# Patient Record
Sex: Female | Born: 1965 | Race: Black or African American | Hispanic: No | Marital: Single | Smoking: Never smoker
Health system: Southern US, Community
[De-identification: ages and names within clinical notes are randomized; demographics above are authoritative.]

## PROBLEM LIST (undated history)

## (undated) DIAGNOSIS — D219 Benign neoplasm of connective and other soft tissue, unspecified: Secondary | ICD-10-CM

## (undated) DIAGNOSIS — G4730 Sleep apnea, unspecified: Secondary | ICD-10-CM

## (undated) HISTORY — PX: UTERINE FIBROID EMBOLIZATION: SHX825

## (undated) HISTORY — PX: CHOLECYSTECTOMY: SHX55

---

## 2012-09-09 ENCOUNTER — Emergency Department (HOSPITAL_BASED_OUTPATIENT_CLINIC_OR_DEPARTMENT_OTHER): Payer: Self-pay

## 2012-09-09 ENCOUNTER — Emergency Department (HOSPITAL_BASED_OUTPATIENT_CLINIC_OR_DEPARTMENT_OTHER)
Admission: EM | Admit: 2012-09-09 | Discharge: 2012-09-09 | Discharge status: Against Medical Advice | Payer: Self-pay | Attending: Emergency Medicine | Admitting: Emergency Medicine

## 2012-09-09 ENCOUNTER — Encounter (HOSPITAL_BASED_OUTPATIENT_CLINIC_OR_DEPARTMENT_OTHER): Payer: Self-pay

## 2012-09-09 DIAGNOSIS — R5381 Other malaise: Secondary | ICD-10-CM | POA: Insufficient documentation

## 2012-09-09 DIAGNOSIS — R079 Chest pain, unspecified: Secondary | ICD-10-CM

## 2012-09-09 DIAGNOSIS — R0602 Shortness of breath: Secondary | ICD-10-CM | POA: Insufficient documentation

## 2012-09-09 DIAGNOSIS — R0789 Other chest pain: Secondary | ICD-10-CM | POA: Insufficient documentation

## 2012-09-09 DIAGNOSIS — R609 Edema, unspecified: Secondary | ICD-10-CM | POA: Insufficient documentation

## 2012-09-09 LAB — COMPREHENSIVE METABOLIC PANEL WITH GFR
ALT: 23 U/L (ref 0–35)
AST: 21 U/L (ref 0–37)
Albumin: 3.9 g/dL (ref 3.5–5.2)
Alkaline Phosphatase: 43 U/L (ref 39–117)
BUN: 7 mg/dL (ref 6–23)
CO2: 27 meq/L (ref 19–32)
Calcium: 9.6 mg/dL (ref 8.4–10.5)
Chloride: 104 meq/L (ref 96–112)
Creatinine, Ser: 0.9 mg/dL (ref 0.50–1.10)
GFR calc Af Amer: 88 mL/min — ABNORMAL LOW
GFR calc non Af Amer: 76 mL/min — ABNORMAL LOW
Glucose, Bld: 111 mg/dL — ABNORMAL HIGH (ref 70–99)
Potassium: 4.7 meq/L (ref 3.5–5.1)
Sodium: 139 meq/L (ref 135–145)
Total Bilirubin: 0.4 mg/dL (ref 0.3–1.2)
Total Protein: 7.9 g/dL (ref 6.0–8.3)

## 2012-09-09 LAB — D-DIMER, QUANTITATIVE: D-Dimer, Quant: 0.29 ug{FEU}/mL (ref 0.00–0.48)

## 2012-09-09 LAB — CBC WITH DIFFERENTIAL/PLATELET
Basophils Absolute: 0 10*3/uL (ref 0.0–0.1)
Eosinophils Relative: 5 % (ref 0–5)
Lymphocytes Relative: 32 % (ref 12–46)
Lymphs Abs: 1.4 10*3/uL (ref 0.7–4.0)
MCV: 92.5 fL (ref 78.0–100.0)
Neutrophils Relative %: 47 % (ref 43–77)
Platelets: 292 10*3/uL (ref 150–400)
RBC: 4.28 MIL/uL (ref 3.87–5.11)
RDW: 12.9 % (ref 11.5–15.5)
WBC: 4.5 10*3/uL (ref 4.0–10.5)

## 2012-09-09 LAB — TROPONIN I: Troponin I: <0.3 ng/mL (ref <0.30)

## 2012-09-09 MED ORDER — ASPIRIN 81 MG PO CHEW
324.0000 mg | CHEWABLE_TABLET | Freq: Once | ORAL | Status: AC
  Administered 2012-09-09: 324 mg via ORAL
  Filled 2012-09-09: qty 4

## 2012-09-09 MED ORDER — NITROGLYCERIN 0.4 MG SL SUBL
0.4000 mg | SUBLINGUAL_TABLET | SUBLINGUAL | Status: DC | PRN
  Administered 2012-09-09 (×2): 0.4 mg via SUBLINGUAL
  Filled 2012-09-09: qty 25

## 2012-09-09 NOTE — ED Notes (Signed)
MD at bedside. 

## 2012-09-09 NOTE — ED Notes (Signed)
Patient transported to X-ray 

## 2012-09-09 NOTE — ED Notes (Signed)
Pt reports a 3 day history of intermittent midsternal chest pain radiating left shouler and right breast.

## 2012-09-09 NOTE — ED Provider Notes (Signed)
History     CSN: [id number]  Arrival date & time 09/09/12  9562   First MD Initiated Contact with Patient 09/09/12 1007      Chief Complaint  Patient presents with  . Chest Pain    (Consider location/radiation/quality/duration/timing/severity/associated sxs/prior treatment) Patient is a 46 y.o. female presenting with chest pain. The history is provided by the patient.  Chest Pain The chest pain began 3 - 5 days ago. Chest pain occurs intermittently. The chest pain is worsening. The quality of the pain is described as pressure-like. Primary symptoms include fatigue and shortness of breath. Pertinent negatives for primary symptoms include no fever, no cough, no wheezing, no palpitations, no abdominal pain, no nausea and no vomiting.  Associated symptoms include lower extremity edema and weakness. Treatments tried: aleve. Risk factors include obesity and lack of exercise (mother with hsitory of atrial fibrillation).  Her family medical history is significant for diabetes in family, hyperlipidemia in family and hypertension in family.  Pertinent negatives for family medical history include: no CAD in family.  Procedure history is negative for cardiac catheterization, echocardiogram and exercise treadmill test.     History reviewed. No pertinent past medical history.  Past Surgical History  Procedure Date  . Cholecystectomy   . Cesarean section     No family history on file.  History  Substance Use Topics  . Smoking status: Never Smoker   . Smokeless tobacco: Not on file  . Alcohol Use: No    OB History    Grav Para Term Preterm Abortions TAB SAB Ect Mult Living                  Review of Systems  Constitutional: Positive for fatigue. Negative for fever.  Respiratory: Positive for shortness of breath. Negative for cough and wheezing.   Cardiovascular: Positive for chest pain. Negative for palpitations.  Gastrointestinal: Negative for nausea, vomiting and abdominal  pain.  Neurological: Positive for weakness.  All other systems reviewed and are negative.    Allergies  Review of patient's allergies indicates no known allergies.  Home Medications  No current outpatient prescriptions on file.  BP 164/84  Pulse 86  Resp 18  Ht 5\' 4"  (1.626 m)  Wt 300 lb (136.079 kg)  BMI 51.49 kg/m2  SpO2 99%  LMP 09/02/2012  Physical Exam  Nursing note and vitals reviewed. Constitutional: She appears well-developed and well-nourished.       Morbidly obese  HENT:  Head: Normocephalic and atraumatic.  Right Ear: External ear normal.  Left Ear: External ear normal.  Nose: Nose normal.  Mouth/Throat: Oropharynx is clear and moist.  Eyes: Conjunctivae normal and EOM are normal. Pupils are equal, round, and reactive to light.  Neck: Normal range of motion. Neck supple.  Cardiovascular: Normal rate, regular rhythm, normal heart sounds and intact distal pulses.   Pulmonary/Chest: Effort normal and breath sounds normal. She exhibits tenderness.  Abdominal: Soft. Bowel sounds are normal.  Musculoskeletal: Normal range of motion.  Neurological: She is alert.  Skin: Skin is warm and dry.  Psychiatric: She has a normal mood and affect. Thought content normal.    ED Course  Procedures (including critical care time)  Labs Reviewed - No data to display No results found.   No diagnosis found.  Date: 09/09/2012  Rate: 79  Rhythm: normal sinus rhythm  QRS Axis: normal  Intervals: normal  ST/T Wave abnormalities: normal  Conduction Disutrbances: none  Narrative Interpretation: unremarkable  MDM  Patient pain free after two nitro and aspirin.  Patient advised to be admitted.  Patient does not want to stay   Patient advised of risks including death and encouraged to return if she changes her mind.         Hilario Quarry, MD 09/09/12 [phone]

## 2014-10-01 ENCOUNTER — Other Ambulatory Visit: Payer: Self-pay | Admitting: Obstetrics and Gynecology

## 2014-10-01 DIAGNOSIS — D259 Leiomyoma of uterus, unspecified: Secondary | ICD-10-CM

## 2014-10-08 ENCOUNTER — Telehealth: Payer: Self-pay | Admitting: Emergency Medicine

## 2014-10-08 ENCOUNTER — Ambulatory Visit
Admission: RE | Admit: 2014-10-08 | Discharge: 2014-10-08 | Disposition: A | Discharge status: Home / Self Care | Payer: Medicaid Other | Source: Ambulatory Visit | Attending: Obstetrics and Gynecology | Admitting: Obstetrics and Gynecology

## 2014-10-08 ENCOUNTER — Other Ambulatory Visit: Payer: Self-pay | Admitting: Emergency Medicine

## 2014-10-08 DIAGNOSIS — D259 Leiomyoma of uterus, unspecified: Secondary | ICD-10-CM

## 2014-10-08 HISTORY — PX: IR GENERIC HISTORICAL: IMG1180011

## 2014-10-08 HISTORY — DX: Sleep apnea, unspecified: G47.30

## 2014-10-08 HISTORY — DX: Benign neoplasm of connective and other soft tissue, unspecified: D21.9

## 2014-10-08 NOTE — Telephone Encounter (Signed)
CALLED PT TO LET HER KNOW OF HER MRI APPT AT [facility] FOR 10-15-14 AT 800/730AM WILL CALL WITH RESULTS

## 2014-10-08 NOTE — Consult Note (Signed)
Chief Complaint: Chief Complaint  Patient presents with  . Advice Only    Consult for [place]    Referring Physician(s): White,Ronda S.  History of Present Illness: Patricia Burgess is a 49 y.o. female  (G1, P3) who presents to the interventional radiology clinic for evaluation of symptomatic uterine fibroids. The patient is unaccompanied and serves as her own historian.  The patient was initially diagnosed with uterine fibroids in July/August of last year on an pelvic ultrasound performed for the evaluation of this menorrhagia. Pelvic ultrasound (performed 03/20/2014) and demonstrates at least 7 discrete fibroids.  Since that time, patient complains of worsening menorrhagia with increasing length of her cycle, now last seen approximate 7-8 days, the heaviest 1-2 days of which are associated with the passage of clots. Patient reports changing pads and tampons every 3-4 hours. Patient denies spotting. Patient denies perimenopausal symptoms.  The patient also reports worsening bulk symptoms which she at least partially attributes to her fibroids including worsening pelvic pain, bloating, urinary frequency, urgency and nocturia. Patient does admit to worsening back pain though admits her back pain preexisted her recent diagnosis of uterine fibroids.   The patient denies bloody or melanotic stools though does report intermittent episodes of constipation. No diarrhea. No dysuria, hematuria or flank pain. No foul-smelling urine.   The patient underwent an endometrial biopsy on 09/24/2014 which was negative for the presence of hyperplasia or carcinoma.  The patient is not interested in undergoing a surgical procedure.     Past Medical History  Diagnosis Date  . Fibroids   . Sleep apnea     Past Surgical History  Procedure Laterality Date  . Cholecystectomy    . Cesarean section      Allergies: Penicillins  Medications: Prior to Admission medications   Not on File    No family history  on file.  History   Social History  . Marital Status: Single    Spouse Name: N/A    Number of Children: N/A  . Years of Education: N/A   Social History Main Topics  . Smoking status: Never Smoker   . Smokeless tobacco: Not on file  . Alcohol Use: No  . Drug Use: No  . Sexual Activity: Not on file   Other Topics Concern  . Not on file   Social History Narrative    ECOG Status: 0 - Asymptomatic  Review of Systems: A 12 point ROS discussed and pertinent positives are indicated in the HPI above.  All other systems are negative.  Review of Systems  Constitutional: Negative.   Respiratory: Negative.   Cardiovascular: Negative for chest pain, palpitations and leg swelling.  Gastrointestinal: Positive for nausea and constipation.  Genitourinary: Positive for urgency, menstrual problem and pelvic pain. Negative for dysuria, hematuria, flank pain and difficulty urinating.  Musculoskeletal: Positive for back pain.  Psychiatric/Behavioral: Negative.     Vital Signs: BP 138/85 mmHg  Pulse 84  Temp(Src) 98.3 F (36.8 C) (Oral)  Resp 14  Ht 5\' 2"  (1.575 m)  Wt 315 lb (142.883 kg)  BMI 57.60 kg/m2  SpO2 98%  LMP 10/03/2014  Physical Exam  Constitutional: She appears well-developed and well-nourished.  Obese   HENT:  Head: Normocephalic and atraumatic.  Cardiovascular: Normal rate, regular rhythm and intact distal pulses.   Easily palpable right DP pulse.  Pulmonary/Chest: Effort normal and breath sounds normal.  Abdominal: Soft. She exhibits no distension and no mass. There is tenderness. There is no rebound and no guarding.  Mild diffuse tenderness with palpation of the lower abdomen.  I am unable to palpate the uterine fundus in large part due to the patient's pannus.  Psychiatric: She has a normal mood and affect. Her behavior is normal.   Imaging:  Pelvic ultrasound -03/20/2014 ([facility]) - the uterus is enlarged measuring 13.9 x 10.3 x 8.2 cm  and demonstrates multiple uterine fibroids (at least 7) measuring up to 3.8 cm in maximal diameter.  Normal appearance and thickness of the endometrium.  The bilateral ovaries are normal in appearance. No discrete ovarian or adnexal mass.  Labs:  The patient underwent an endometrial biopsy on 09/24/2014 which was negative for the presence of hyperplasia or carcinoma  CBC: No results for input(s): WBC, HGB, HCT, PLT in the last 8760 hours.  COAGS: No results for input(s): INR, APTT in the last 8760 hours.  BMP: No results for input(s): NA, K, CL, CO2, GLUCOSE, BUN, CALCIUM, CREATININE, GFRNONAA, GFRAA in the last 8760 hours.  Invalid input(s): CMP  LIVER FUNCTION TESTS: No results for input(s): BILITOT, AST, ALT, ALKPHOS, PROT, ALBUMIN in the last 8760 hours.  Assessment and Plan:  Patricia Burgess is a 49 y.o. female  (G1, P58) who presents to the interventional radiology clinic for evaluation of symptomatic uterine fibroids. The patient was initially diagnosed with uterine fibroids in July/August of last year on an pelvic ultrasound performed for the evaluation of this menorrhagia.  Since that time, patient complains of worsening menorrhagia and bulk symptoms.   Prolonged discussions were held with the patient regarding treatment of uterine fibroids, including conservative management and surgical interventions (hysterectomy and myomectomy). At the present time, the patient wishes to avoid a surgical procedure. As such, prolonged discussions were held with the patient regarding the benefits and risks (including but not limited to bleeding, infection, nontarget embolization, contrast/radiation exposure and incomplete embolization) of uterine fibroid embolization.  I discussed with the patient that uterine fibroid embolization will likely have a marked beneficial result with her menorrhagia, though that it could take several months until she noticed a subjective improvement in her bulk-like symptoms  if they are indeed attributable to her fibroids.  Following this candid conversation, the patient wishes to proceed with uterine fibroid embolization. As such, a contrast enhanced pelvic MRI will be obtained to better evaluate her fibroid burden as well as to ensure fibroid enhancement.   The patient has had an endometrial biopsy performed 09/2014 which was negative for the presence of hyperplasia or carcinoma.  We will call the referring physician's office to see if the patient has had a recent Pap smear. If not, one will be arranged.  Pending the results of the abdominal MRI, the patient will be scheduled to go undergo uterine fibroid embolization, either at [facility] or [facility], depending on convenience of scheduling at a time between her cycles. The patient has no preference for which facility the procedure will be performed. Regardless, the procedure will entail an overnight observation for continued observation and PCA usage.  Thank you for this interesting consult.  I greatly enjoyed meeting Patricia Burgess and look forward to participating in her care.  Ronny Bacon, MD   I spent a total of 30 minutes face to face in clinical consultation, greater than 50% of which was counseling/coordinating care for symptomatic uterine fibroids.  SignedSandi Mariscal 10/08/2014, 10:53 AM

## 2014-10-22 ENCOUNTER — Telehealth: Payer: Self-pay | Admitting: Emergency Medicine

## 2014-10-22 NOTE — Telephone Encounter (Signed)
S/w pt and explained that Dr Pascal Lux reviewed MRI and she is a good candidate for [place].  I will have Tiff at [facility] to contact her and set up procedure.  She will be on her cycle next week!

## 2014-10-23 ENCOUNTER — Other Ambulatory Visit: Payer: Self-pay | Admitting: Interventional Radiology

## 2014-10-23 DIAGNOSIS — D259 Leiomyoma of uterus, unspecified: Secondary | ICD-10-CM

## 2014-11-07 ENCOUNTER — Other Ambulatory Visit: Payer: Self-pay | Admitting: Radiology

## 2014-11-07 DIAGNOSIS — D259 Leiomyoma of uterus, unspecified: Secondary | ICD-10-CM

## 2014-11-09 ENCOUNTER — Telehealth: Payer: Self-pay | Admitting: Emergency Medicine

## 2014-11-09 ENCOUNTER — Other Ambulatory Visit (HOSPITAL_COMMUNITY): Payer: Medicaid Other

## 2014-11-09 NOTE — Telephone Encounter (Signed)
Pt called w/ c/c of severe lower abd pain. She is taking 2 tabs of oxycodone q6hrs for pain.  Its only lasting 4hrs.  Taking 800mg  of IBU in between pain meds. Also stated that stool softner is not helping.  Paged Drake Leach- 143pmLennette Bihari stated for pt to take Murilax.  If any additional pain after that to call us back.  Called pt with above info and scheduled her f/u visit.- 1355pm

## 2014-11-16 ENCOUNTER — Other Ambulatory Visit: Payer: Self-pay | Admitting: Radiology

## 2014-11-16 DIAGNOSIS — D259 Leiomyoma of uterus, unspecified: Secondary | ICD-10-CM

## 2014-11-16 MED ORDER — IBUPROFEN 800 MG PO TABS: 800.0000 mg | ORAL_TABLET | Freq: Four times a day (QID) | ORAL | Status: DC | PRN

## 2014-11-16 NOTE — Progress Notes (Signed)
11:15 am  Patient phoned requesting a refill of Ibuprofen.  States that she is experiencing mild-moderate cramping discomfort post [place].  Afebrile.  Spotting, no discharge.  Notified Dr. Pascal Lux.  Ibuprofen Rx called in to patient's pharmacy.    Patricia Coste Riki Rusk, RN 11/16/2014 11:45 AM

## 2014-11-23 ENCOUNTER — Other Ambulatory Visit (HOSPITAL_COMMUNITY): Payer: Medicaid Other

## 2014-11-23 ENCOUNTER — Ambulatory Visit (HOSPITAL_COMMUNITY): Payer: Medicaid Other

## 2014-11-24 ENCOUNTER — Ambulatory Visit
Admission: RE | Admit: 2014-11-24 | Discharge: 2014-11-24 | Disposition: A | Discharge status: Home / Self Care | Payer: Medicaid Other | Source: Ambulatory Visit | Attending: Radiology | Admitting: Radiology

## 2014-11-24 DIAGNOSIS — D259 Leiomyoma of uterus, unspecified: Secondary | ICD-10-CM

## 2014-11-24 NOTE — Progress Notes (Signed)
Patient ID: Patricia Burgess, female   DOB: Feb 04, 1966, 49 y.o.   MRN: [id number]    Chief Complaint: Post uterine fibroid embolization performed 11/06/2014  Referring Physician(s): Nunzio Cobbs S  History of Present Illness: Patricia Burgess is a 49 y.o. female (G1, P54) who returns to the interventional radiology clinic following bilateral uterine artery embolization performed 11/06/2014. The patient is unaccompanied and serves as her own historian.  Patient was initially seen in consultation for symptomatic uterine fibroids at the IR clinic on 10/08/2014. At presentation, the patient reported worsening menorrhagia with increasing length of the time of her cycle, lasting of present is 7-8 days, the heaviest 1-2 days of which were associated with the passage of blood clots. The patient also admitted to bulk symptoms including pelvic pain, bloating, urinary frequency, urgency and nocturia.   The patient underwent a pelvic MRI on 10/15/2014 which demonstrated multiple heterogeneously enhancing uterine fibroids and subsequently underwent a technically successful bilateral uterine artery embolization on 11/06/2014 performed at [facility]. The patient was discharged the following day without incident.  The patient has recovered uneventfully following the uterine artery embolization. The patient states she is no longer taking any ibuprofen. The patient has not had a subsequent menstrual cycle since the uterine fibroid embolization though subjectively states improvement in her pelvic pain. Patient denies, spotting, discharge, fever or chills. No hematuria or flank pain.    At this point, the patient is overall pleased with the procedure.   Past Medical History  Diagnosis Date  . Fibroids   . Sleep apnea     Past Surgical History  Procedure Laterality Date  . Cholecystectomy    . Cesarean section      Allergies: Penicillins  Medications: Prior to Admission medications   Medication Sig Start  Date End Date Taking? Authorizing Provider  ibuprofen (ADVIL,MOTRIN) 800 MG tablet Take 1 tablet (800 mg total) by mouth every 6 (six) hours as needed for moderate pain. 11/16/14   Sandi Mariscal, MD     No family history on file.  History   Social History  . Marital Status: Single    Spouse Name: N/A  . Number of Children: N/A  . Years of Education: N/A   Social History Main Topics  . Smoking status: Never Smoker   . Smokeless tobacco: Not on file  . Alcohol Use: No  . Drug Use: No  . Sexual Activity: Not on file   Other Topics Concern  . Not on file   Social History Narrative    Review of Systems: A 12 point ROS discussed and pertinent positives are indicated in the HPI above.  All other systems are negative.  Review of Systems  Constitutional: Negative for fever, chills and fatigue.  Respiratory: Negative.   Cardiovascular: Negative.   Gastrointestinal: Negative.   Genitourinary: Negative for urgency, hematuria, flank pain, vaginal bleeding, vaginal discharge and pelvic pain.  Psychiatric/Behavioral: Negative.     Vital Signs: There were no vitals taken for this visit.  Physical Exam  Constitutional: She appears well-developed and well-nourished.  HENT:  Head: Normocephalic and atraumatic.  Cardiovascular: Normal rate, regular rhythm and intact distal pulses.   Pulmonary/Chest: Effort normal and breath sounds normal.  Abdominal: Soft. Bowel sounds are normal. She exhibits no mass. There is no tenderness.  Again, I am unable to palpate the uterine fundus.  Skin: Skin is warm and dry.  Psychiatric: She has a normal mood and affect.   Imaging:  Selected images from the patient's preprocedural  pelvic MRI performed 10/15/2014 as well as angiographic images from the bilateral uterine artery embolization performed 11/06/2014 were reviewed in detail with the patient.  Labs:  CBC: No results for input(s): WBC, HGB, HCT, PLT in the last 8760 hours.  COAGS: No results  for input(s): INR, APTT in the last 8760 hours.  BMP: No results for input(s): NA, K, CL, CO2, GLUCOSE, BUN, CALCIUM, CREATININE, GFRNONAA, GFRAA in the last 8760 hours.  Invalid input(s): CMP  LIVER FUNCTION TESTS: No results for input(s): BILITOT, AST, ALT, ALKPHOS, PROT, ALBUMIN in the last 8760 hours.   Assessment and Plan:  [name] is a 49 y.o. female (G1, P42) who returns to the interventional radiology clinic following bilateral uterine artery embolization performed 11/06/2014 at [facility]. The patient was discharged the following day without incident.  The patient has recovered uneventfully following the uterine artery embolization. The patient states she is no longer taking any ibuprofen. The patient has not had a subsequent menstrual cycle since the uterine fibroid embolization though subjectively states improvement in her pelvic pain. Patient denies, spotting, discharge, fever or chills. No hematuria or flank pain.  At this point, the patient is overall pleased with the procedure.  Prolonged discussions were held with the patient educating her that her subsequent measure cycles may be variable, potentially with recurrent menorrhagia and/or spotting though that her menorrhagia and bulk symptoms should steadily improve over the coming 3-6 months. The patient demonstrated excellent understanding of the above discussion.  Patient will be contacted in approximately 3 months to ensure continued post procedural improvement. At that time, the patient will be scheduled for a follow-up appointment at the interventional radiology clinic approximately 6 months following the fibroid embolization.  A post procedural pelvic MRI will only be obtained if the patient is experiencing persistent and/or recurrent fibroid related symptoms.  The patient was encouraged to call the IR clinic with any questions or concerns.  SignedSandi Mariscal 11/24/2014, 2:14 PM   I spent a total of 25  Minutes in face to face in clinical consultation, greater than 50% of which was counseling/coordinating care for symptomatic uterine fibroids, post uterine fibroid embolization.

## 2014-11-24 NOTE — Progress Notes (Signed)
2.5 wk follow up [place]  LMP:  11/04/2014 (2 days prior to [place]).  States that she has not had a menstrual cycle post [place].  Denies spotting.  Minimal cramping.  Takes Ibuprofen prn. Some fatigue but has resumed normal activities.  Overall feels that she is doing well.  Jim Philemon Riki Rusk, RN 11/24/2014 3:43 PM

## 2014-12-11 ENCOUNTER — Telehealth: Payer: Self-pay | Admitting: *Deleted

## 2014-12-11 NOTE — Telephone Encounter (Signed)
Pt is post [place] 11/06/14 Dr Pascal Lux Pt called and said last night she had nausea and  unbearable pain in her lower stomach. She started taking 800 mg of Ibuprofen. Today she just has mild discomfort but much better.  She also states yesterday during the day she went to Urgent Care with back pain and they told her she has bronchitis. I called Dr Pascal Lux and he is doing a f/u phone call with the patient.

## 2015-02-18 ENCOUNTER — Telehealth: Payer: Self-pay | Admitting: Radiology

## 2015-02-18 NOTE — Telephone Encounter (Signed)
3 month s/p [place] call for status update.  Patient reports that she is "doing fantastic"!    LMP:  March 2016.  Has experienced some episodes of minimal spotting.  Less cramping & improvement re:  Abd/pelvic pain & pressure.  Will contact patient late Summer 2016 to schedule 6 mo follow up office visit with Dr Ronny Bacon.  Chloe Miyoshi Riki Rusk, RN 02/18/2015 8:45 AM

## 2015-04-15 ENCOUNTER — Other Ambulatory Visit (HOSPITAL_COMMUNITY): Payer: Self-pay | Admitting: Interventional Radiology

## 2015-04-15 DIAGNOSIS — D259 Leiomyoma of uterus, unspecified: Secondary | ICD-10-CM

## 2015-04-19 ENCOUNTER — Emergency Department (HOSPITAL_BASED_OUTPATIENT_CLINIC_OR_DEPARTMENT_OTHER)
Admission: EM | Admit: 2015-04-19 | Discharge: 2015-04-19 | Disposition: A | Discharge status: Home / Self Care | Payer: Medicaid Other | Attending: Emergency Medicine | Admitting: Emergency Medicine

## 2015-04-19 ENCOUNTER — Encounter (HOSPITAL_BASED_OUTPATIENT_CLINIC_OR_DEPARTMENT_OTHER): Payer: Self-pay | Admitting: *Deleted

## 2015-04-19 ENCOUNTER — Emergency Department (HOSPITAL_BASED_OUTPATIENT_CLINIC_OR_DEPARTMENT_OTHER): Payer: Medicaid Other

## 2015-04-19 DIAGNOSIS — Z86018 Personal history of other benign neoplasm: Secondary | ICD-10-CM | POA: Diagnosis not present

## 2015-04-19 DIAGNOSIS — S6992XA Unspecified injury of left wrist, hand and finger(s), initial encounter: Secondary | ICD-10-CM | POA: Diagnosis not present

## 2015-04-19 DIAGNOSIS — S3992XA Unspecified injury of lower back, initial encounter: Secondary | ICD-10-CM | POA: Insufficient documentation

## 2015-04-19 DIAGNOSIS — S199XXA Unspecified injury of neck, initial encounter: Secondary | ICD-10-CM | POA: Diagnosis present

## 2015-04-19 DIAGNOSIS — S0990XA Unspecified injury of head, initial encounter: Secondary | ICD-10-CM | POA: Insufficient documentation

## 2015-04-19 DIAGNOSIS — Y998 Other external cause status: Secondary | ICD-10-CM | POA: Diagnosis not present

## 2015-04-19 DIAGNOSIS — M25532 Pain in left wrist: Secondary | ICD-10-CM

## 2015-04-19 DIAGNOSIS — Z8669 Personal history of other diseases of the nervous system and sense organs: Secondary | ICD-10-CM | POA: Diagnosis not present

## 2015-04-19 DIAGNOSIS — S161XXA Strain of muscle, fascia and tendon at neck level, initial encounter: Secondary | ICD-10-CM

## 2015-04-19 DIAGNOSIS — Y92410 Unspecified street and highway as the place of occurrence of the external cause: Secondary | ICD-10-CM | POA: Diagnosis not present

## 2015-04-19 DIAGNOSIS — Z880 Allergy status to penicillin: Secondary | ICD-10-CM | POA: Diagnosis not present

## 2015-04-19 DIAGNOSIS — Y9389 Activity, other specified: Secondary | ICD-10-CM | POA: Diagnosis not present

## 2015-04-19 DIAGNOSIS — Z791 Long term (current) use of non-steroidal anti-inflammatories (NSAID): Secondary | ICD-10-CM | POA: Insufficient documentation

## 2015-04-19 MED ORDER — IBUPROFEN 800 MG PO TABS
800.0000 mg | ORAL_TABLET | Freq: Once | ORAL | Status: AC
  Administered 2015-04-19: 800 mg via ORAL
  Filled 2015-04-19: qty 1

## 2015-04-19 MED ORDER — CYCLOBENZAPRINE HCL 5 MG PO TABS: 5.0000 mg | ORAL_TABLET | Freq: Three times a day (TID) | ORAL | Status: DC | PRN

## 2015-04-19 MED ORDER — NAPROXEN 500 MG PO TABS: 500.0000 mg | ORAL_TABLET | Freq: Two times a day (BID) | ORAL | Status: DC
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
## 2015-04-19 NOTE — Discharge Instructions (Signed)
Return to the ED with any concerns including chest pain, difficulty breathing, vomiting, weakness of arms or legs, decreased level of alertness/lethargy, or any other alarming symptoms

## 2015-05-19 ENCOUNTER — Inpatient Hospital Stay: Admission: RE | Admit: 2015-05-19 | Payer: Medicaid Other | Source: Ambulatory Visit

## 2015-05-29 ENCOUNTER — Encounter (HOSPITAL_BASED_OUTPATIENT_CLINIC_OR_DEPARTMENT_OTHER): Payer: Self-pay | Admitting: *Deleted

## 2015-05-29 ENCOUNTER — Emergency Department (HOSPITAL_BASED_OUTPATIENT_CLINIC_OR_DEPARTMENT_OTHER)
Admission: EM | Admit: 2015-05-29 | Discharge: 2015-05-29 | Disposition: A | Discharge status: Home / Self Care | Payer: Medicaid Other | Attending: Emergency Medicine | Admitting: Emergency Medicine

## 2015-05-29 ENCOUNTER — Emergency Department (HOSPITAL_BASED_OUTPATIENT_CLINIC_OR_DEPARTMENT_OTHER): Payer: Medicaid Other

## 2015-05-29 DIAGNOSIS — Z86018 Personal history of other benign neoplasm: Secondary | ICD-10-CM | POA: Insufficient documentation

## 2015-05-29 DIAGNOSIS — Z880 Allergy status to penicillin: Secondary | ICD-10-CM | POA: Diagnosis not present

## 2015-05-29 DIAGNOSIS — M25571 Pain in right ankle and joints of right foot: Secondary | ICD-10-CM | POA: Insufficient documentation

## 2015-05-29 DIAGNOSIS — M79671 Pain in right foot: Secondary | ICD-10-CM | POA: Insufficient documentation

## 2015-05-29 DIAGNOSIS — Z791 Long term (current) use of non-steroidal anti-inflammatories (NSAID): Secondary | ICD-10-CM | POA: Insufficient documentation

## 2015-05-29 DIAGNOSIS — Z8669 Personal history of other diseases of the nervous system and sense organs: Secondary | ICD-10-CM | POA: Insufficient documentation

## 2015-05-29 MED ORDER — IBUPROFEN 400 MG PO TABS
600.0000 mg | ORAL_TABLET | Freq: Once | ORAL | Status: AC
  Administered 2015-05-29: 600 mg via ORAL
  Filled 2015-05-29 (×2): qty 1

## 2015-05-29 MED ORDER — IBUPROFEN 600 MG PO TABS: 600.0000 mg | ORAL_TABLET | Freq: Four times a day (QID) | ORAL | Status: DC | PRN

## 2015-05-29 NOTE — ED Provider Notes (Signed)
CSN: [id number]     Arrival date & time 05/29/15  0277 History   First MD Initiated Contact with Patient 05/29/15 0848     Chief Complaint  Patient presents with  . Foot Pain     (Consider location/radiation/quality/duration/timing/severity/associated sxs/prior Treatment) HPI  49 year old female who presents with right foot and ankle pain. She has a history of OSA. Reports that 2-3 days ago upon standing up from a seated position she noted sharp pain over the dorsum of her right foot that radiated into the ankle. She denies any associating falls or trauma. Has noted gradually increasing pain in that area, primarily with weightbearing. Denies any numbness or weakness. Denies any associated swelling. She has otherwise been in her usual state of health. Denies any calf tenderness or swelling.  Past Medical History  Diagnosis Date  . Fibroids   . Sleep apnea    Past Surgical History  Procedure Laterality Date  . Cholecystectomy    . Cesarean section     History reviewed. No pertinent family history. Social History  Substance Use Topics  . Smoking status: Never Smoker   . Smokeless tobacco: None  . Alcohol Use: No   OB History    No data available     Review of Systems  Constitutional: Negative for fever.  Musculoskeletal: Negative for joint swelling.  Skin: Negative for wound.  Allergic/Immunologic: Negative for immunocompromised state.  Neurological: Negative for weakness and numbness.  Hematological: Does not bruise/bleed easily.  All other systems reviewed and are negative.     Allergies  Penicillins  Home Medications   Prior to Admission medications   Medication Sig Start Date End Date Taking? Authorizing Provider  cyclobenzaprine (FLEXERIL) 5 MG tablet Take 1 tablet (5 mg total) by mouth 3 (three) times daily as needed for muscle spasms. 04/19/15   Alfonzo Beers, MD  ibuprofen (ADVIL,MOTRIN) 800 MG tablet Take 1 tablet (800 mg total) by mouth every 6 (six) hours  as needed for moderate pain. 11/16/14   Sandi Mariscal, MD  naproxen (NAPROSYN) 500 MG tablet Take 1 tablet (500 mg total) by mouth 2 (two) times daily. 04/19/15   Alfonzo Beers, MD   BP 156/79 mmHg  Pulse 77  Temp(Src) 98.5 F (36.9 C) (Oral)  Resp 20  Ht 5\' 4"  (1.626 m)  Wt 310 lb (140.615 kg)  BMI 53.19 kg/m2  SpO2 95%  LMP 05/24/2015 (Exact Date) Physical Exam Physical Exam  Nursing note and vitals reviewed. Constitutional: Well developed, well nourished, non-toxic, and in no acute distress Head: Normocephalic and atraumatic.  Mouth/Throat: Oropharynx is clear and moist.  Neck: Normal range of motion. Neck supple.  Cardiovascular: Normal rate and regular rhythm.  +2 DP and PT pulses bilaterally. Pulmonary/Chest: Effort normal.  Abdominal: Soft. Non-distended. Musculoskeletal: Normal range of motion of right ankle with range of motion. Tender to palpation over dorsum of foot medially. No swelling or deformities.  Neurological: Alert, fluent speech, sensation to light touch in tact throughout. Full strength ankle dorsi/plantar flexion.  Skin: Skin is warm and dry.  Psychiatric: Cooperative  ED Course  Procedures (including critical care time) Labs Review Labs Reviewed - No data to display  Imaging Review Dg Ankle Complete Right  05/29/2015   CLINICAL DATA:  Lateral right ankle pain radiating to the metatarsals for 2 days  EXAM: RIGHT ANKLE - COMPLETE 3+ VIEW  COMPARISON:  None  FINDINGS: Moderate spurring at the inferior calcaneus. There is soft tissue swelling about the medial malleolus. No fracture.  No dislocation.  IMPRESSION: No acute bony pathology.   Electronically Signed   By: Marybelle Killings M.D.   On: 05/29/2015 09:35   Dg Foot Complete Right  05/29/2015   CLINICAL DATA:  Lateral right ankle pain that radiates to the third metatarsal. Symptoms for 2 days. No known injury.  EXAM: RIGHT FOOT COMPLETE - 3+ VIEW  COMPARISON:  None.  FINDINGS: There is no evidence of fracture or  dislocation.  No focal or notable degenerative change.  Plantar heel spur noted.  IMPRESSION: 1. No finding to explain acute pain. 2. Heel spur.   Electronically Signed   By: Monte Fantasia M.D.   On: 05/29/2015 09:34   I have personally reviewed and evaluated these images and lab results as part of my medical decision-making.   MDM   Final diagnoses:  Right foot pain   In short, this a 49 year old woman who presents with 3 days of right ankle and foot pain. Pain reproducible with palpation and range of motion. No deformities or swelling noted and there is does not appear to be any evidence of joint effusion. X-ray of her foot and ankle will be performed. X-rays visualized showing no acute traumatic injuries or etiologies of her pain. Given reproducibility of her pain with palpation and range of motion is skeletal in nature. given NSAID for pain relief. Discussed supportive instructions for home.   Forde Dandy, MD 05/29/15 1010

## 2015-05-29 NOTE — ED Notes (Signed)
approx 2 days ago began having rt foot pain, pain progressively worse, having difficulty placing wt on rt foot and ambulating

## 2015-05-29 NOTE — ED Notes (Signed)
Denies any injury to rt foot

## 2015-05-29 NOTE — ED Notes (Signed)
Ace wrap applied to rt foot

## 2015-05-29 NOTE — Discharge Instructions (Signed)
Take motrin and tylenol for pain control. Return for worsening symptoms, including worsening pain, numbness or weakness, or any other symptoms concerning to you. Please follow-up with her primary care doctor. Ice your foot at rest and keep elevated.   Musculoskeletal Pain Musculoskeletal pain is muscle and boney aches and pains. These pains can occur in any part of the body. Your caregiver may treat you without knowing the cause of the pain. They may treat you if blood or urine tests, X-rays, and other tests were normal.  CAUSES There is often not a definite cause or reason for these pains. These pains may be caused by a type of germ (virus). The discomfort may also come from overuse. Overuse includes working out too hard when your body is not fit. Boney aches also come from weather changes. Bone is sensitive to atmospheric pressure changes. HOME CARE INSTRUCTIONS   Ask when your test results will be ready. Make sure you get your test results.  Only take over-the-counter or prescription medicines for pain, discomfort, or fever as directed by your caregiver. If you were given medications for your condition, do not drive, operate machinery or power tools, or sign legal documents for 24 hours. Do not drink alcohol. Do not take sleeping pills or other medications that may interfere with treatment.  Continue all activities unless the activities cause more pain. When the pain lessens, slowly resume normal activities. Gradually increase the intensity and duration of the activities or exercise.  During periods of severe pain, bed rest may be helpful. Lay or sit in any position that is comfortable.  Putting ice on the injured area.  Put ice in a bag.  Place a towel between your skin and the bag.  Leave the ice on for 15 to 20 minutes, 3 to 4 times a day.  Follow up with your caregiver for continued problems and no reason can be found for the pain. If the pain becomes worse or does not go away, it may  be necessary to repeat tests or do additional testing. Your caregiver may need to look further for a possible cause. SEEK IMMEDIATE MEDICAL CARE IF:  You have pain that is getting worse and is not relieved by medications.  You develop chest pain that is associated with shortness or breath, sweating, feeling sick to your stomach (nauseous), or throw up (vomit).  Your pain becomes localized to the abdomen.  You develop any new symptoms that seem different or that concern you. MAKE SURE YOU:   Understand these instructions.  Will watch your condition.  Will get help right away if you are not doing well or get worse. Document Released: 09/04/2005 Document Revised: 11/27/2011 Document Reviewed: 05/09/2013 [facility] Patient Information 2015 [place], [place]. This information is not intended to replace advice given to you by your health care provider. Make sure you discuss any questions you have with your health care provider.

## 2015-05-29 NOTE — ED Notes (Signed)
MD at bedside. 

## 2015-06-02 ENCOUNTER — Ambulatory Visit
Admission: RE | Admit: 2015-06-02 | Discharge: 2015-06-02 | Disposition: A | Discharge status: Home / Self Care | Payer: Medicaid Other | Source: Ambulatory Visit | Attending: Interventional Radiology | Admitting: Interventional Radiology

## 2015-06-02 DIAGNOSIS — D259 Leiomyoma of uterus, unspecified: Secondary | ICD-10-CM
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
## 2015-10-22 DIAGNOSIS — R7301 Impaired fasting glucose: Secondary | ICD-10-CM | POA: Insufficient documentation

## 2015-10-22 DIAGNOSIS — N920 Excessive and frequent menstruation with regular cycle: Secondary | ICD-10-CM | POA: Insufficient documentation

## 2015-10-22 DIAGNOSIS — M461 Sacroiliitis, not elsewhere classified: Secondary | ICD-10-CM | POA: Insufficient documentation

## 2016-01-24 DIAGNOSIS — Z6841 Body Mass Index (BMI) 40.0 and over, adult: Secondary | ICD-10-CM

## 2016-09-20 ENCOUNTER — Encounter: Payer: Self-pay | Admitting: Interventional Radiology

## 2017-04-10 ENCOUNTER — Emergency Department (HOSPITAL_BASED_OUTPATIENT_CLINIC_OR_DEPARTMENT_OTHER): Payer: Self-pay

## 2017-04-10 ENCOUNTER — Emergency Department (HOSPITAL_BASED_OUTPATIENT_CLINIC_OR_DEPARTMENT_OTHER)
Admission: EM | Admit: 2017-04-10 | Discharge: 2017-04-10 | Disposition: A | Discharge status: Home / Self Care | Payer: Self-pay | Attending: Emergency Medicine | Admitting: Emergency Medicine

## 2017-04-10 ENCOUNTER — Encounter (HOSPITAL_BASED_OUTPATIENT_CLINIC_OR_DEPARTMENT_OTHER): Payer: Self-pay

## 2017-04-10 DIAGNOSIS — M546 Pain in thoracic spine: Secondary | ICD-10-CM | POA: Insufficient documentation

## 2017-04-10 DIAGNOSIS — R062 Wheezing: Secondary | ICD-10-CM | POA: Insufficient documentation

## 2017-04-10 DIAGNOSIS — R0602 Shortness of breath: Secondary | ICD-10-CM | POA: Insufficient documentation

## 2017-04-10 LAB — CBC WITH DIFFERENTIAL/PLATELET
BASOS ABS: 0 10*3/uL (ref 0.0–0.1)
BASOS PCT: 1 %
EOS ABS: 0.2 10*3/uL (ref 0.0–0.7)
EOS PCT: 5 %
HEMATOCRIT: 38.5 % (ref 36.0–46.0)
Hemoglobin: 13 g/dL (ref 12.0–15.0)
Lymphocytes Relative: 36 %
Lymphs Abs: 1.6 10*3/uL (ref 0.7–4.0)
MCH: 31.3 pg (ref 26.0–34.0)
MCHC: 33.8 g/dL (ref 30.0–36.0)
MCV: 92.5 fL (ref 78.0–100.0)
MONO ABS: 0.6 10*3/uL (ref 0.1–1.0)
Monocytes Relative: 15 %
NEUTROS ABS: 2 10*3/uL (ref 1.7–7.7)
Neutrophils Relative %: 45 %
PLATELETS: 260 10*3/uL (ref 150–400)
RBC: 4.16 MIL/uL (ref 3.87–5.11)
RDW: 13.2 % (ref 11.5–15.5)
WBC: 4.4 10*3/uL (ref 4.0–10.5)

## 2017-04-10 LAB — URINALYSIS, ROUTINE W REFLEX MICROSCOPIC
BILIRUBIN URINE: NEGATIVE
Glucose, UA: NEGATIVE mg/dL
Hgb urine dipstick: NEGATIVE
KETONES UR: NEGATIVE mg/dL
NITRITE: NEGATIVE
PROTEIN: NEGATIVE mg/dL
Specific Gravity, Urine: 1.015 (ref 1.005–1.030)
pH: 6 (ref 5.0–8.0)

## 2017-04-10 LAB — COMPREHENSIVE METABOLIC PANEL
ALBUMIN: 4 g/dL (ref 3.5–5.0)
ALK PHOS: 40 U/L (ref 38–126)
ALT: 21 U/L (ref 14–54)
AST: 22 U/L (ref 15–41)
Anion gap: 7 (ref 5–15)
BILIRUBIN TOTAL: 0.7 mg/dL (ref 0.3–1.2)
BUN: 7 mg/dL (ref 6–20)
CALCIUM: 9.1 mg/dL (ref 8.9–10.3)
CO2: 27 mmol/L (ref 22–32)
CREATININE: 0.87 mg/dL (ref 0.44–1.00)
Chloride: 103 mmol/L (ref 101–111)
GFR calc Af Amer: >60 mL/min (ref >60)
GFR calc non Af Amer: >60 mL/min (ref >60)
GLUCOSE: 97 mg/dL (ref 65–99)
Potassium: 4 mmol/L (ref 3.5–5.1)
Sodium: 137 mmol/L (ref 135–145)
TOTAL PROTEIN: 7.8 g/dL (ref 6.5–8.1)

## 2017-04-10 LAB — PREGNANCY, URINE: PREG TEST UR: NEGATIVE

## 2017-04-10 LAB — URINALYSIS, MICROSCOPIC (REFLEX)

## 2017-04-10 MED ORDER — ORPHENADRINE CITRATE ER 100 MG PO TB12: 100.0000 mg | ORAL_TABLET | Freq: Two times a day (BID) | ORAL | 0 refills | Status: DC | PRN

## 2017-04-10 NOTE — ED Notes (Signed)
Patient transported to X-ray 

## 2017-04-10 NOTE — ED Triage Notes (Signed)
C/o mid/lower back pain x "few days"-hx of lower back pain but not mid back pain-denies injury-NAD-steady gait

## 2017-04-10 NOTE — Discharge Instructions (Signed)
Read the information below.  Use the prescribed medication as directed.  Please discuss all new medications with your pharmacist.  You may return to the Emergency Department at any time for worsening condition or any new symptoms that concern you.    If you develop fevers, loss of control of bowel or bladder, weakness or numbness in your legs, or are unable to walk, return to the ER for a recheck.  °

## 2017-04-10 NOTE — ED Provider Notes (Signed)
[facility] Provider Note   CSN: [id number] Arrival date & time: 04/10/17  1151     History   Chief Complaint Chief Complaint  Patient presents with  . Back Pain    HPI Patricia Burgess is a 52 y.o. female.  HPI   Patient with hx chronic low back pain p/w midthoracic back pain that is bilateral, L>R that began two days ago, gradually worsening.  7/10 intensity at rest, 10/10 with movement or walking.  Associated wheezing, SOB, not feeling well.   The day before the pain began she accidentally took 5 - 800mg  ibuprofen thinking they were another pill.  She took several doses of activated charcoal over the next few days to cleanse herself.   Has had dark stool but she attributes this to the charcoal.  Denies fevers, abdominal pain, weakness or numbness of the extremities, urinary or bowel symptoms.    Past Medical History:  Diagnosis Date  . Fibroids   . Sleep apnea     Patient Active Problem List   Diagnosis Date Noted  . Fibroid, uterine     Past Surgical History:  Procedure Laterality Date  . CESAREAN SECTION    . CHOLECYSTECTOMY    . IR GENERIC HISTORICAL  10/08/2014   IR RADIOLOGIST EVAL & MGMT 10/08/2014 Sandi Mariscal, MD [facility] INTERV RAD  . UTERINE FIBROID EMBOLIZATION      OB History    No data available       Home Medications    Prior to Admission medications   Medication Sig Start Date End Date Taking? Authorizing Provider  orphenadrine (NORFLEX) 100 MG tablet Take 1 tablet (100 mg total) by mouth 2 (two) times daily as needed for muscle spasms (pain). 04/10/17   Clayton Bibles, PA-C    Family History No family history on file.  Social History Social History  Substance Use Topics  . Smoking status: Never Smoker  . Smokeless tobacco: Never Used  . Alcohol use No     Allergies   Penicillins   Review of Systems Review of Systems  All other systems reviewed and are negative.    Physical Exam Updated Vital Signs BP (!) 143/88 (BP  Location: Left Arm)   Pulse 82   Temp 98.6 F (37 C) (Oral)   Resp 18   Ht 5\' 4"  (1.626 m)   Wt (!) 151.5 kg (334 lb)   SpO2 100%   BMI 57.33 kg/m   Physical Exam  Constitutional: She appears well-developed and well-nourished. No distress.  HENT:  Head: Normocephalic and atraumatic.  Neck: Neck supple.  Cardiovascular: Normal rate and regular rhythm.   Pulmonary/Chest: Effort normal and breath sounds normal. No respiratory distress. She has no wheezes. She has no rales.  Abdominal: Soft. She exhibits no distension and no mass. There is no tenderness. There is no rebound and no guarding.  Musculoskeletal:       Back:  Spine nontender, no crepitus, or stepoffs. Lower extremities:  Strength 5/5, sensation intact, distal pulses intact.     Neurological: She is alert.  Skin: She is not diaphoretic.  Nursing note and vitals reviewed.    ED Treatments / Results  Labs (all labs ordered are listed, but only abnormal results are displayed) Labs Reviewed  URINALYSIS, ROUTINE W REFLEX MICROSCOPIC - Abnormal; Notable for the following:       Result Value   APPearance CLOUDY (*)    Leukocytes, UA MODERATE (*)    All other components within normal  limits  URINALYSIS, MICROSCOPIC (REFLEX) - Abnormal; Notable for the following:    Bacteria, UA MANY (*)    Squamous Epithelial / LPF 6-30 (*)    All other components within normal limits  COMPREHENSIVE METABOLIC PANEL  CBC WITH DIFFERENTIAL/PLATELET  PREGNANCY, URINE    EKG  EKG Interpretation None       Radiology Dg Chest 2 View  Result Date: 04/10/2017 CLINICAL DATA:  Mid to low back pain for several days with no known injury. No gait disturbance. EXAM: CHEST  2 VIEW COMPARISON:  Chest x-ray dated December 13, 2015 FINDINGS: The lungs are adequately inflated and clear. The heart is top-normal in size but stable. The pulmonary vascularity is not engorged. The mediastinum is normal in width. The trachea is midline. The bony thorax  exhibits no acute abnormality. IMPRESSION: There is no acute cardiopulmonary abnormality. Electronically Signed   By: David  Martinique M.D.   On: 04/10/2017 13:05   Dg Thoracic Spine 2 View  Result Date: 04/10/2017 CLINICAL DATA:  Mid and lower back pain for the past several days without known injury. EXAM: THORACIC SPINE 2 VIEWS COMPARISON:  Chest x-ray dated December 13, 2015 FINDINGS: The thoracic vertebral bodies are preserved in height. The disc space heights are reasonably well-maintained. The pedicles appear intact. There are no abnormal paravertebral soft tissue densities. IMPRESSION: There is no acute or significant chronic bony abnormality of the thoracic spine. Electronically Signed   By: David  Martinique M.D.   On: 04/10/2017 13:06   Dg Lumbar Spine Complete  Result Date: 04/10/2017 CLINICAL DATA:  Mid to lower back pain for the past several days. No acute injury. EXAM: LUMBAR SPINE - COMPLETE 4+ VIEW COMPARISON:  Lumbar spine series dated April 22, 2015 FINDINGS: The lumbar vertebral bodies are preserved in height. The pedicles and transverse processes are intact. There is no pars defect or spondylolisthesis. There is moderate disc space narrowing at L4-5 which has progressed since the previous study. There is no significant facet joint hypertrophy. There is sclerosis of the iliac and sacral aspects of the lower SI joints. IMPRESSION: There is no acute bony abnormality of the lumbar spine. There has been progression of disc space height loss at L4-5 since the previous study. There is scleroses associated with the inferior aspects of the SI joints bilaterally. If there are symptoms referrable to the SI joints, a dedicated SI joint series would be useful. Electronically Signed   By: David  Martinique M.D.   On: 04/10/2017 13:09    Procedures Procedures (including critical care time)  Medications Ordered in ED Medications - No data to display   Initial Impression / Assessment and Plan / ED Course  I  have reviewed the triage vital signs and the nursing notes.  Pertinent labs & imaging results that were available during my care of the patient were reviewed by me and considered in my medical decision making (see chart for details).    Afebrile, nontoxic patient with bilateral thoracic back pain without injury.  It is reproducible with palpation and movement.  Likely muscular.  Pt's urine appears infected but also with squamous cells, will culture given the area of her pain around CVA, would recommend call for symptom check if positive.  Pt and I discussed this and engaged in joint medical decision making to establish this plan.  Pt to be treated for more likely muscular-etiology of pain.  Labs, xrays reassuring.  Changes of L spine and SI joints are known to patient.  D/C home with norflex, PCP follow up.  Discussed result, findings, treatment, and follow up  with patient.  Pt given return precautions.  Pt verbalizes understanding and agrees with plan.       Final Clinical Impressions(s) / ED Diagnoses   Final diagnoses:  Acute bilateral thoracic back pain    New Prescriptions New Prescriptions   ORPHENADRINE (NORFLEX) 100 MG TABLET    Take 1 tablet (100 mg total) by mouth 2 (two) times daily as needed for muscle spasms (pain).     Clayton Bibles, [place] 04/10/17 [place], Wenda Overland, MD 04/11/17 [phone]

## 2018-08-07 IMAGING — CR DG LUMBAR SPINE COMPLETE 4+V
5 series · 5 of 5 positions shown · non-contrast
Comparison: Lumbar spine series dated April 22, 2015

CLINICAL DATA: Mid to lower back pain for the past several days. No
acute injury.

EXAM:
LUMBAR SPINE - COMPLETE 4+ VIEW

[t l-spine a.p.]
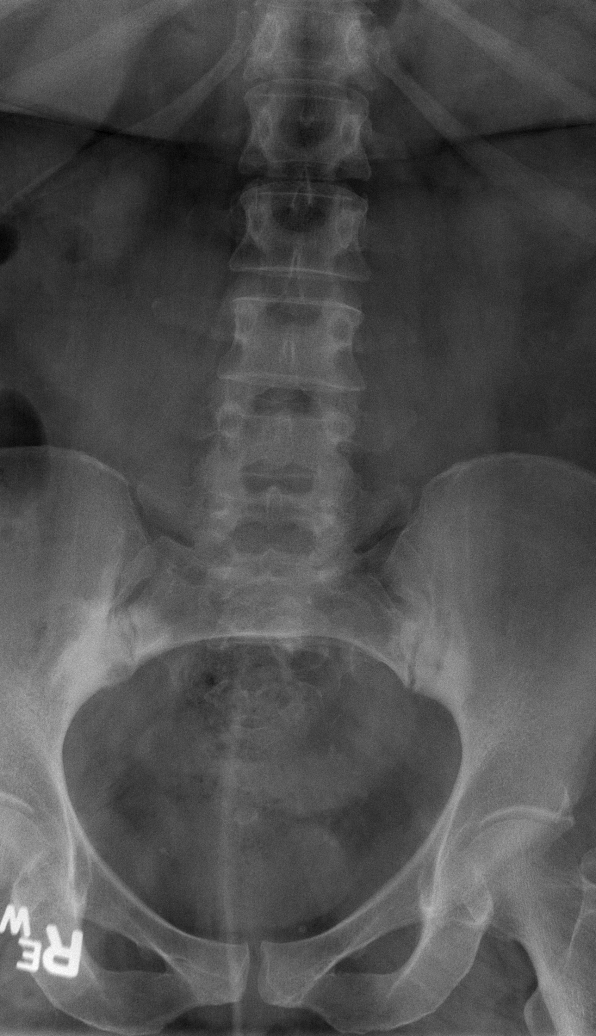

[t l-spine oblique exposure * (1 of 2)]
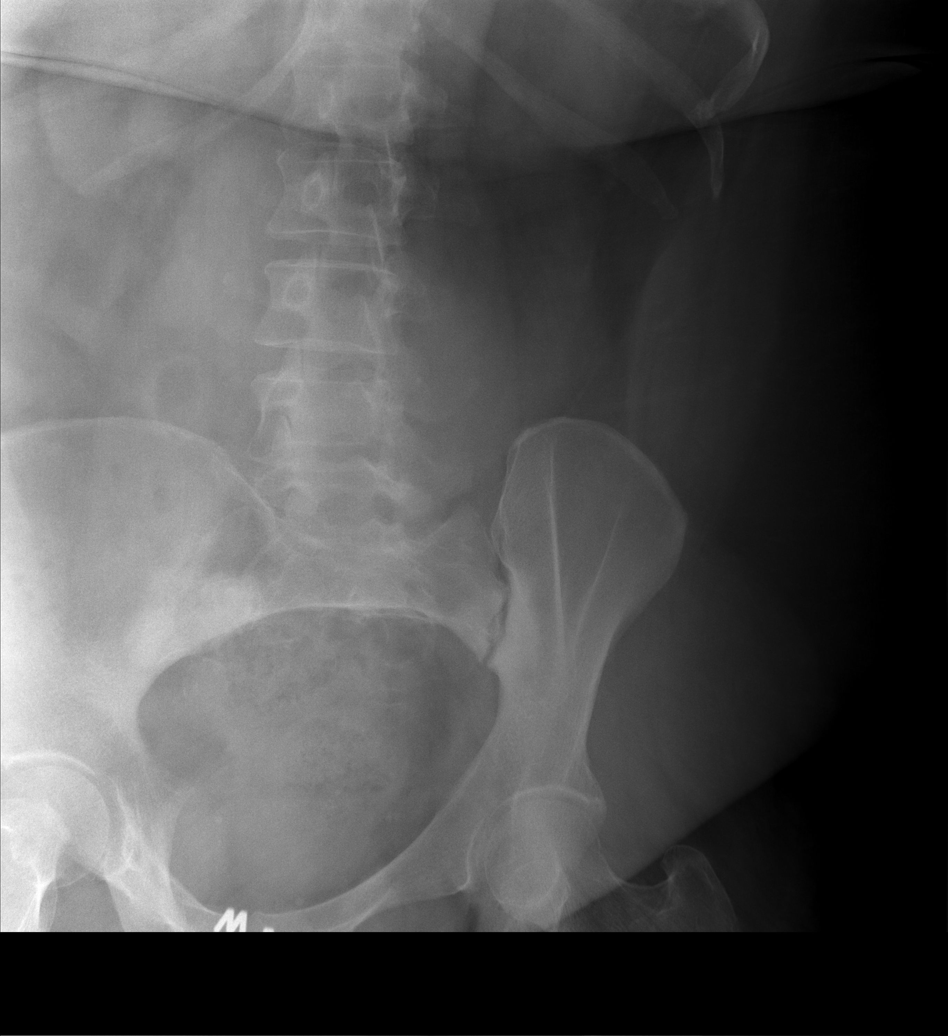

[t l-spine oblique exposure * (2 of 2)]
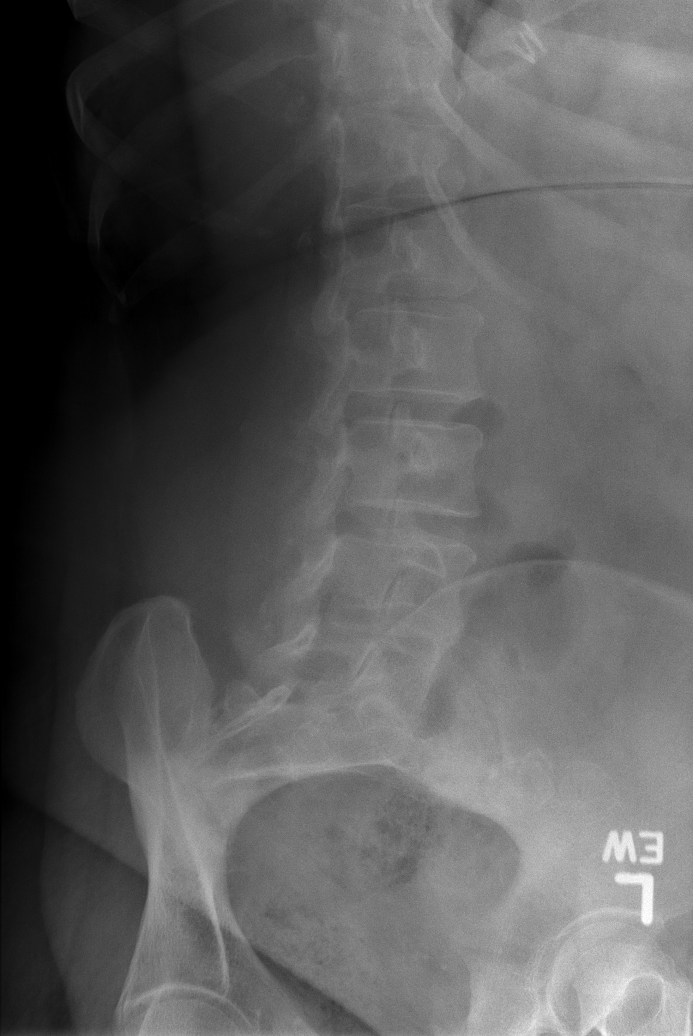

[t l-spine lat]
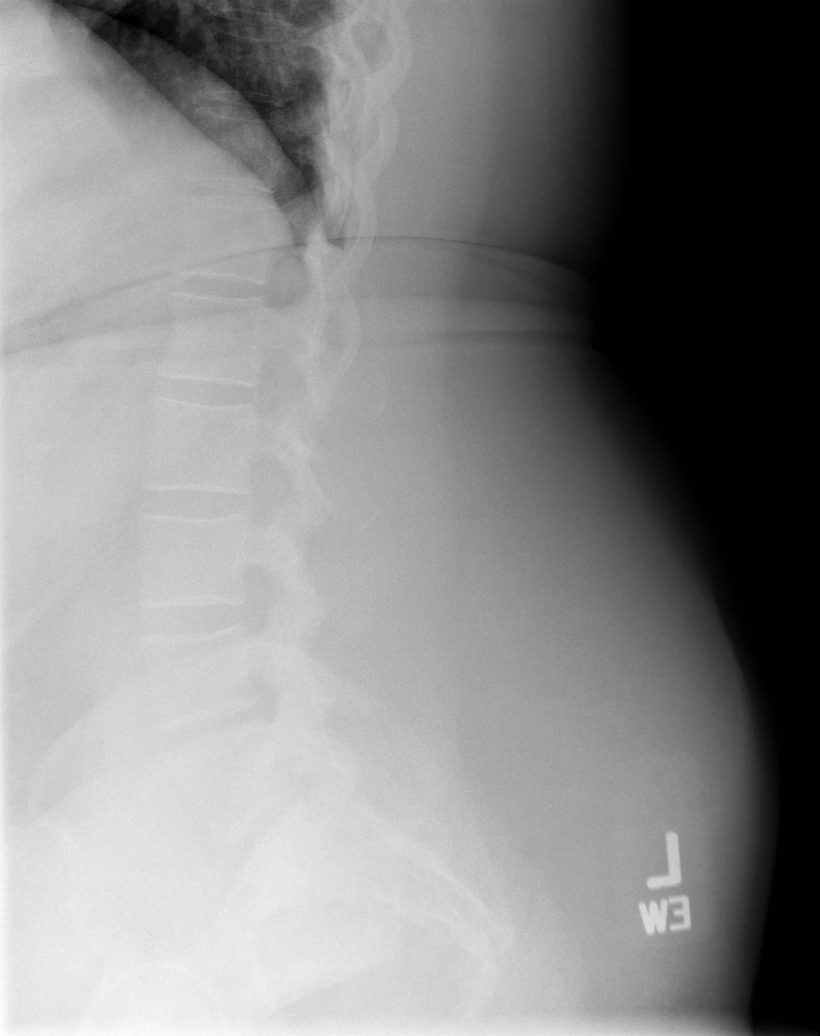

[t l-spine l5-s1 spot]
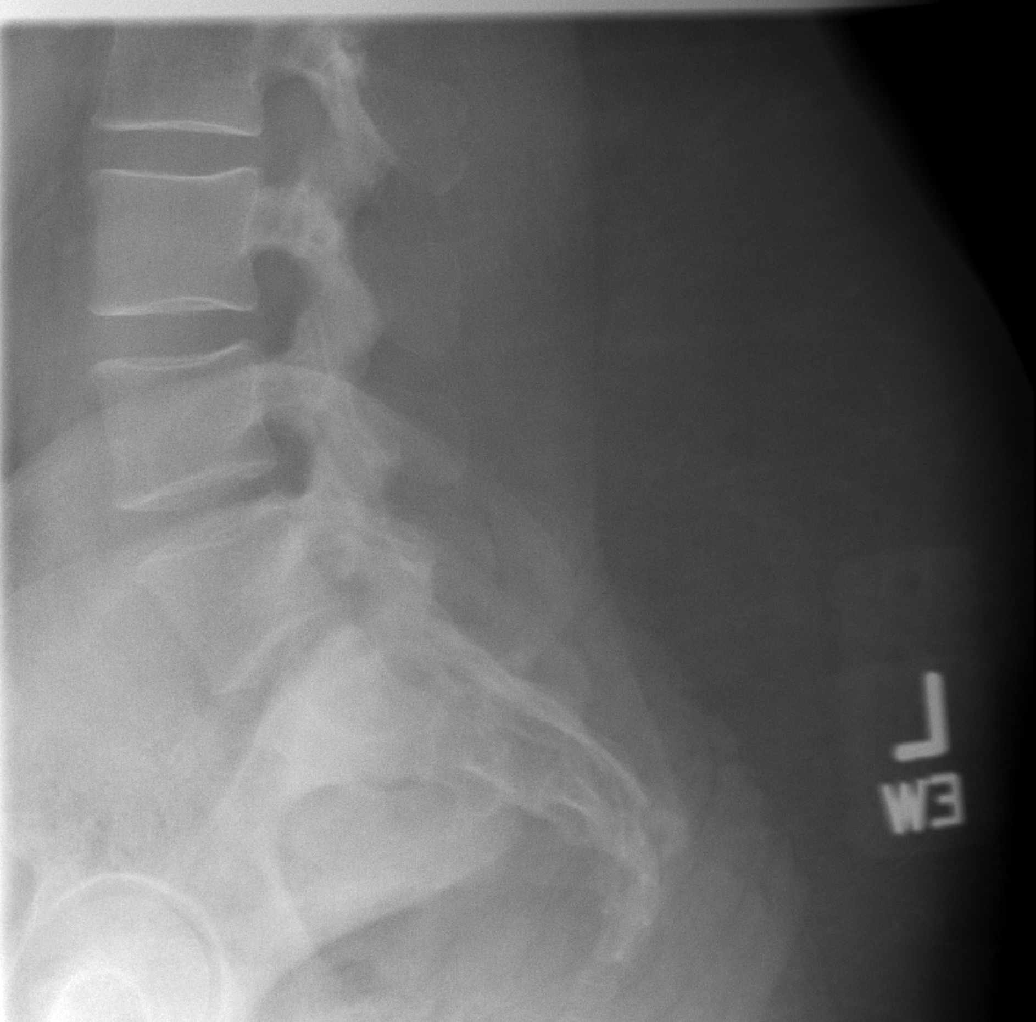

[5 of 5 positions shown; findings below may reference images not displayed]

FINDINGS: The lumbar vertebral bodies are preserved in height. The pedicles
and transverse processes are intact. There is no pars defect or
spondylolisthesis. There is moderate disc space narrowing at L4-5
which has progressed since the previous study. There is no
significant facet joint hypertrophy. There is sclerosis of the iliac
and sacral aspects of the lower SI joints.
IMPRESSION: There is no acute bony abnormality of the lumbar spine. There has
been progression of disc space height loss at L4-5 since the
previous study.

There is scleroses associated with the inferior aspects of the SI
joints bilaterally. If there are symptoms referrable to the SI
joints, a dedicated SI joint series would be useful.

## 2021-05-23 ENCOUNTER — Encounter (HOSPITAL_BASED_OUTPATIENT_CLINIC_OR_DEPARTMENT_OTHER): Payer: Self-pay

## 2021-05-23 ENCOUNTER — Emergency Department (HOSPITAL_BASED_OUTPATIENT_CLINIC_OR_DEPARTMENT_OTHER): Payer: Self-pay

## 2021-05-23 ENCOUNTER — Emergency Department (HOSPITAL_BASED_OUTPATIENT_CLINIC_OR_DEPARTMENT_OTHER)
Admission: EM | Admit: 2021-05-23 | Discharge: 2021-05-23 | Disposition: A | Discharge status: Home / Self Care | Payer: Self-pay | Attending: Emergency Medicine | Admitting: Emergency Medicine

## 2021-05-23 ENCOUNTER — Other Ambulatory Visit: Payer: Self-pay

## 2021-05-23 DIAGNOSIS — M79661 Pain in right lower leg: Secondary | ICD-10-CM | POA: Insufficient documentation

## 2021-05-23 DIAGNOSIS — S2002XA Contusion of left breast, initial encounter: Secondary | ICD-10-CM | POA: Insufficient documentation

## 2021-05-23 DIAGNOSIS — W19XXXA Unspecified fall, initial encounter: Secondary | ICD-10-CM | POA: Insufficient documentation

## 2021-05-23 MED ORDER — METHOCARBAMOL 500 MG PO TABS
500.0000 mg | ORAL_TABLET | Freq: Once | ORAL | Status: AC
  Administered 2021-05-23: 500 mg via ORAL
  Filled 2021-05-23: qty 1

## 2021-05-23 MED ORDER — HYDROCODONE-ACETAMINOPHEN 5-325 MG PO TABS
1.0000 | ORAL_TABLET | Freq: Once | ORAL | Status: AC
  Administered 2021-05-23: 1 via ORAL
  Filled 2021-05-23: qty 1

## 2021-05-23 MED ORDER — METHOCARBAMOL 500 MG PO TABS: 500.0000 mg | ORAL_TABLET | Freq: Two times a day (BID) | ORAL | 0 refills | Status: DC

## 2021-05-23 NOTE — ED Provider Notes (Signed)
[facility] EMERGENCY DEPARTMENT Provider Note   CSN: [id number] Arrival date & time: 05/23/21  1540     History Chief Complaint  Patient presents with   Fall   Shortness of Breath    Patricia Burgess is a 55 y.o. female with a past medical history significant for fibroids and sleep apnea who presents to the ED due to left-sided rib pain and right lower extremity pain for the past 9 days after a mechanical fall.  Patient states she tripped and fell directly on her left side.  No head injury or loss of consciousness.  She is not currently on any blood thinners.  Patient states pain is worse with palpation.  She also endorses right lower extremity pain located throughout her right shin and dorsum of her foot.  Pain associated with mild ecchymosis.  She has been taken Tylenol with no relief.   History obtained from patient and past medical records. No interpreter used during encounter.       Past Medical History:  Diagnosis Date   Fibroids    Sleep apnea     Patient Active Problem List   Diagnosis Date Noted   Fibroid, uterine     Past Surgical History:  Procedure Laterality Date   CESAREAN SECTION     CHOLECYSTECTOMY     IR GENERIC HISTORICAL  10/08/2014   IR RADIOLOGIST EVAL & MGMT 10/08/2014 Sandi Mariscal, MD [facility] INTERV RAD   UTERINE FIBROID EMBOLIZATION       OB History   No obstetric history on file.     No family history on file.  Social History   Tobacco Use   Smoking status: Never   Smokeless tobacco: Never  Vaping Use   Vaping Use: Never used  Substance Use Topics   Alcohol use: No   Drug use: No    Home Medications Prior to Admission medications   Medication Sig Start Date End Date Taking? Authorizing Provider  methocarbamol (ROBAXIN) 500 MG tablet Take 1 tablet (500 mg total) by mouth 2 (two) times daily. 05/23/21  Yes Runette Scifres, Druscilla Brownie, PA-C  orphenadrine (NORFLEX) 100 MG tablet Take 1 tablet (100 mg total) by mouth 2 (two) times daily as  needed for muscle spasms (pain). 04/10/17   Clayton Bibles, PA-C    Allergies    Penicillins  Review of Systems   Review of Systems  Respiratory:  Negative for shortness of breath.   Cardiovascular:  Positive for chest pain (left sided rib).  Gastrointestinal:  Positive for abdominal pain (left-sided).  Musculoskeletal:  Positive for arthralgias.  All other systems reviewed and are negative.  Physical Exam Updated Vital Signs BP (!) 195/101 (BP Location: Right Wrist)   Pulse 91   Temp 98.3 F (36.8 C) (Oral)   Resp 20   Ht '5\' 5"'$  (1.651 m)   Wt (!) 145.2 kg   SpO2 98%   BMI 53.25 kg/m   Physical Exam Vitals and nursing note reviewed.  Constitutional:      General: She is not in acute distress.    Appearance: She is not ill-appearing.  HENT:     Head: Normocephalic.  Eyes:     Pupils: Pupils are equal, round, and reactive to light.  Cardiovascular:     Rate and Rhythm: Normal rate and regular rhythm.     Pulses: Normal pulses.     Heart sounds: Normal heart sounds. No murmur heard.   No friction rub. No gallop.  Pulmonary:  Effort: Pulmonary effort is normal.     Breath sounds: Normal breath sounds.  Chest:     Comments: TTP to left-side of ribs.  No deformity or crepitus.  Mild ecchymosis below left breast. Abdominal:     General: Abdomen is flat. There is no distension.     Palpations: Abdomen is soft.     Tenderness: There is no abdominal tenderness. There is no guarding or rebound.     Comments: TTP to left side of abdomen without any ecchymosis. No rebound or guarding.   Musculoskeletal:        General: Normal range of motion.     Cervical back: Neck supple.     Comments: TTP to right distal tibia/fibula. TTP over right dorsum of foot. Pedal pulses palpable. Full ROM or knee and ankle. Soft compartments.   Skin:    General: Skin is warm and dry.  Neurological:     General: No focal deficit present.     Mental Status: She is alert.  Psychiatric:         Mood and Affect: Mood normal.        Behavior: Behavior normal.    ED Results / Procedures / Treatments   Labs (all labs ordered are listed, but only abnormal results are displayed) Labs Reviewed - No data to display  EKG None  Radiology DG Ribs Unilateral W/Chest Left  Result Date: 05/23/2021 CLINICAL DATA:  Fall EXAM: LEFT RIBS AND CHEST - 3+ VIEW COMPARISON:  04/10/2017 FINDINGS: Single-view chest demonstrates no focal opacity or pleural effusion. Normal cardiac size. No definitive pneumothorax. Left rib series demonstrates no definite acute displaced left rib fracture IMPRESSION: Negative. Electronically Signed   By: Donavan Foil M.D.   On: 05/23/2021 17:05   DG Tibia/Fibula Right  Result Date: 05/23/2021 CLINICAL DATA:  Injury. EXAM: RIGHT TIBIA AND FIBULA - 2 VIEW COMPARISON:  Right ankle radiographs 05/29/2015. FINDINGS: The mineralization and alignment are normal. There is no evidence of acute fracture or dislocation. Mild tricompartmental degenerative changes at the knee. Generalized soft tissue prominence without foreign body or soft tissue emphysema. IMPRESSION: No acute osseous findings or foreign bodies identified. Electronically Signed   By: Richardean Sale M.D.   On: 05/23/2021 17:58   DG Foot Complete Right  Result Date: 05/23/2021 CLINICAL DATA:  Injury. EXAM: RIGHT FOOT COMPLETE - 3+ VIEW COMPARISON:  Ankle and foot radiographs 05/29/2015. FINDINGS: The mineralization and alignment are normal. There is no evidence of acute fracture or dislocation. Mild joint space narrowing at the 1st metatarsophalangeal joint and a small plantar calcaneal spur are noted. No focal soft tissue abnormality identified. IMPRESSION: Stable right foot radiographs.  No acute findings demonstrated. Electronically Signed   By: Richardean Sale M.D.   On: 05/23/2021 18:00    Procedures Procedures   Medications Ordered in ED Medications  HYDROcodone-acetaminophen (NORCO/VICODIN) 5-325 MG per tablet  1 tablet (1 tablet Oral Given 05/23/21 1751)  methocarbamol (ROBAXIN) tablet 500 mg (500 mg Oral Given 05/23/21 1751)    ED Course  I have reviewed the triage vital signs and the nursing notes.  Pertinent labs & imaging results that were available during my care of the patient were reviewed by me and considered in my medical decision making (see chart for details).    MDM Rules/Calculators/A&P                          55 year old female presents to the ED due  to left-sided rib pain and right lower extremity pain x9 days after mechanical fall.  No head injury or loss of consciousness.  Upon arrival, stable vitals.  Patient in no acute distress.  Physical exam significant for left-sided rib tenderness.  Mild ecchymosis under left breast.  No crepitus or deformity.  X-ray ordered at triage which I personally reviewed which is negative for any rib fractures or pneumothorax.  Tenderness to distal right tibia and fibula. X-rays ordered which I personally reviewed which are negative for any bony fractures.  Patient given Norco and Robaxin here in the ED.  Patient discharged with Robaxin.  No tachycardia or hypoxia. Low suspicion for PE/DVT. Low suspicion for emergent intraabdominal etiologies given physical exam findings. Advised patient to follow-up with PCP symptoms unimproved over the next week. Strict ED precautions discussed with patient. Patient states understanding and agrees to plan. Patient discharged home in no acute distress and stable vitals  Discussed case with Dr. Tyrone Nine who agrees with assessment and plan.  Final Clinical Impression(s) / ED Diagnoses Final diagnoses:  Fall, initial encounter    Rx / DC Orders ED Discharge Orders          Ordered    methocarbamol (ROBAXIN) 500 MG tablet  2 times daily        05/23/21 1805             Suzy Bouchard, PA-C 05/23/21 [place], [place], DO 05/23/21 1815

## 2021-05-23 NOTE — ED Triage Notes (Signed)
Pt arrives with c/o pain to left side/ribs after falling about 9 days ago. Pt reports worsening pain and SOB since with bruising to left chest. Pt also c/o pain to right leg with swelling to right foot

## 2021-05-23 NOTE — Discharge Instructions (Addendum)
It was a pleasure taking care of you today.  As discussed, all of your x-rays did not show any broken bones.  I am sending you home with a muscle relaxer.  Take as needed for pain.  Muscle relaxer can cause drowsiness so do not drive or operate machinery while on the medication.  You may take over-the-counter ibuprofen or Tylenol as needed for pain.  Return to the ER for new or worsening symptoms.

## 2021-12-23 ENCOUNTER — Other Ambulatory Visit (HOSPITAL_BASED_OUTPATIENT_CLINIC_OR_DEPARTMENT_OTHER): Payer: Self-pay

## 2021-12-26 ENCOUNTER — Other Ambulatory Visit (HOSPITAL_BASED_OUTPATIENT_CLINIC_OR_DEPARTMENT_OTHER): Payer: Self-pay

## 2021-12-26 MED ORDER — MOUNJARO 2.5 MG/0.5ML ~~LOC~~ SOAJ
SUBCUTANEOUS | 0 refills | Status: DC
  Filled 2021-12-26 – 2022-05-02 (×3): qty 2, 28d supply, fill #0

## 2021-12-27 ENCOUNTER — Other Ambulatory Visit (HOSPITAL_BASED_OUTPATIENT_CLINIC_OR_DEPARTMENT_OTHER): Payer: Self-pay

## 2021-12-28 ENCOUNTER — Other Ambulatory Visit (HOSPITAL_BASED_OUTPATIENT_CLINIC_OR_DEPARTMENT_OTHER): Payer: Self-pay

## 2021-12-29 ENCOUNTER — Other Ambulatory Visit (HOSPITAL_BASED_OUTPATIENT_CLINIC_OR_DEPARTMENT_OTHER): Payer: Self-pay

## 2021-12-30 ENCOUNTER — Other Ambulatory Visit (HOSPITAL_BASED_OUTPATIENT_CLINIC_OR_DEPARTMENT_OTHER): Payer: Self-pay

## 2022-01-02 ENCOUNTER — Other Ambulatory Visit (HOSPITAL_BASED_OUTPATIENT_CLINIC_OR_DEPARTMENT_OTHER): Payer: Self-pay

## 2022-03-23 ENCOUNTER — Other Ambulatory Visit (HOSPITAL_BASED_OUTPATIENT_CLINIC_OR_DEPARTMENT_OTHER): Payer: Self-pay

## 2022-03-23 MED ORDER — MOUNJARO 2.5 MG/0.5ML ~~LOC~~ SOAJ
SUBCUTANEOUS | 0 refills | Status: AC
  Filled 2022-03-23 (×3): qty 2, 28d supply, fill #0

## 2022-03-24 ENCOUNTER — Other Ambulatory Visit (HOSPITAL_BASED_OUTPATIENT_CLINIC_OR_DEPARTMENT_OTHER): Payer: Self-pay

## 2022-03-28 ENCOUNTER — Other Ambulatory Visit (HOSPITAL_BASED_OUTPATIENT_CLINIC_OR_DEPARTMENT_OTHER): Payer: Self-pay

## 2022-03-29 ENCOUNTER — Other Ambulatory Visit (HOSPITAL_BASED_OUTPATIENT_CLINIC_OR_DEPARTMENT_OTHER): Payer: Self-pay

## 2022-03-30 ENCOUNTER — Other Ambulatory Visit (HOSPITAL_BASED_OUTPATIENT_CLINIC_OR_DEPARTMENT_OTHER): Payer: Self-pay

## 2022-04-04 ENCOUNTER — Other Ambulatory Visit (HOSPITAL_BASED_OUTPATIENT_CLINIC_OR_DEPARTMENT_OTHER): Payer: Self-pay

## 2022-04-11 ENCOUNTER — Other Ambulatory Visit (HOSPITAL_BASED_OUTPATIENT_CLINIC_OR_DEPARTMENT_OTHER): Payer: Self-pay

## 2022-04-12 ENCOUNTER — Other Ambulatory Visit (HOSPITAL_BASED_OUTPATIENT_CLINIC_OR_DEPARTMENT_OTHER): Payer: Self-pay

## 2022-04-13 ENCOUNTER — Other Ambulatory Visit (HOSPITAL_BASED_OUTPATIENT_CLINIC_OR_DEPARTMENT_OTHER): Payer: Self-pay

## 2022-04-14 ENCOUNTER — Other Ambulatory Visit (HOSPITAL_BASED_OUTPATIENT_CLINIC_OR_DEPARTMENT_OTHER): Payer: Self-pay

## 2022-04-17 ENCOUNTER — Other Ambulatory Visit (HOSPITAL_BASED_OUTPATIENT_CLINIC_OR_DEPARTMENT_OTHER): Payer: Self-pay

## 2022-04-18 ENCOUNTER — Other Ambulatory Visit (HOSPITAL_BASED_OUTPATIENT_CLINIC_OR_DEPARTMENT_OTHER): Payer: Self-pay

## 2022-04-19 ENCOUNTER — Other Ambulatory Visit (HOSPITAL_BASED_OUTPATIENT_CLINIC_OR_DEPARTMENT_OTHER): Payer: Self-pay

## 2022-04-21 ENCOUNTER — Other Ambulatory Visit (HOSPITAL_BASED_OUTPATIENT_CLINIC_OR_DEPARTMENT_OTHER): Payer: Self-pay

## 2022-05-03 ENCOUNTER — Other Ambulatory Visit (HOSPITAL_BASED_OUTPATIENT_CLINIC_OR_DEPARTMENT_OTHER): Payer: Self-pay

## 2022-05-04 ENCOUNTER — Other Ambulatory Visit (HOSPITAL_BASED_OUTPATIENT_CLINIC_OR_DEPARTMENT_OTHER): Payer: Self-pay

## 2022-05-09 ENCOUNTER — Other Ambulatory Visit (HOSPITAL_BASED_OUTPATIENT_CLINIC_OR_DEPARTMENT_OTHER): Payer: Self-pay

## 2022-05-12 ENCOUNTER — Other Ambulatory Visit (HOSPITAL_BASED_OUTPATIENT_CLINIC_OR_DEPARTMENT_OTHER): Payer: Self-pay

## 2022-05-18 ENCOUNTER — Other Ambulatory Visit (HOSPITAL_BASED_OUTPATIENT_CLINIC_OR_DEPARTMENT_OTHER): Payer: Self-pay

## 2022-05-25 ENCOUNTER — Other Ambulatory Visit (HOSPITAL_BASED_OUTPATIENT_CLINIC_OR_DEPARTMENT_OTHER): Payer: Self-pay

## 2022-05-26 ENCOUNTER — Other Ambulatory Visit (HOSPITAL_BASED_OUTPATIENT_CLINIC_OR_DEPARTMENT_OTHER): Payer: Self-pay

## 2022-05-29 ENCOUNTER — Other Ambulatory Visit (HOSPITAL_BASED_OUTPATIENT_CLINIC_OR_DEPARTMENT_OTHER): Payer: Self-pay

## 2022-05-30 ENCOUNTER — Other Ambulatory Visit (HOSPITAL_BASED_OUTPATIENT_CLINIC_OR_DEPARTMENT_OTHER): Payer: Self-pay

## 2022-05-31 ENCOUNTER — Ambulatory Visit: Payer: Medicaid Other | Admitting: Internal Medicine

## 2022-06-01 ENCOUNTER — Other Ambulatory Visit (HOSPITAL_BASED_OUTPATIENT_CLINIC_OR_DEPARTMENT_OTHER): Payer: Self-pay

## 2022-06-22 ENCOUNTER — Emergency Department (HOSPITAL_BASED_OUTPATIENT_CLINIC_OR_DEPARTMENT_OTHER): Payer: BLUE CROSS/BLUE SHIELD

## 2022-06-22 ENCOUNTER — Encounter (HOSPITAL_BASED_OUTPATIENT_CLINIC_OR_DEPARTMENT_OTHER): Payer: Self-pay | Admitting: Internal Medicine

## 2022-06-22 ENCOUNTER — Encounter (HOSPITAL_COMMUNITY): Payer: Self-pay

## 2022-06-22 ENCOUNTER — Inpatient Hospital Stay (HOSPITAL_BASED_OUTPATIENT_CLINIC_OR_DEPARTMENT_OTHER)
Admission: EM | Admit: 2022-06-22 | Discharge: 2022-06-25 | DRG: 392 | Disposition: A | Discharge status: Home / Self Care | Payer: BLUE CROSS/BLUE SHIELD | Attending: Internal Medicine | Admitting: Internal Medicine

## 2022-06-22 ENCOUNTER — Other Ambulatory Visit: Payer: Self-pay

## 2022-06-22 DIAGNOSIS — K5792 Diverticulitis of intestine, part unspecified, without perforation or abscess without bleeding: Principal | ICD-10-CM | POA: Diagnosis present

## 2022-06-22 DIAGNOSIS — B9620 Unspecified Escherichia coli [E. coli] as the cause of diseases classified elsewhere: Secondary | ICD-10-CM | POA: Diagnosis present

## 2022-06-22 DIAGNOSIS — Z6841 Body Mass Index (BMI) 40.0 and over, adult: Secondary | ICD-10-CM

## 2022-06-22 DIAGNOSIS — R7881 Bacteremia: Secondary | ICD-10-CM | POA: Diagnosis present

## 2022-06-22 DIAGNOSIS — R7303 Prediabetes: Secondary | ICD-10-CM | POA: Diagnosis not present

## 2022-06-22 DIAGNOSIS — Z20822 Contact with and (suspected) exposure to covid-19: Secondary | ICD-10-CM | POA: Diagnosis present

## 2022-06-22 DIAGNOSIS — G4733 Obstructive sleep apnea (adult) (pediatric): Secondary | ICD-10-CM

## 2022-06-22 LAB — URINALYSIS, ROUTINE W REFLEX MICROSCOPIC
Bilirubin Urine: NEGATIVE
Glucose, UA: NEGATIVE mg/dL
Ketones, ur: NEGATIVE mg/dL
Nitrite: NEGATIVE
Protein, ur: NEGATIVE mg/dL
Specific Gravity, Urine: 1.015 (ref 1.005–1.030)
pH: 5.5 (ref 5.0–8.0)

## 2022-06-22 LAB — PROTIME-INR
INR: 1.1 (ref 0.8–1.2)
Prothrombin Time: 14 seconds (ref 11.4–15.2)

## 2022-06-22 LAB — CBG MONITORING, ED: Glucose-Capillary: 121 mg/dL — ABNORMAL HIGH (ref 70–99)

## 2022-06-22 LAB — COMPREHENSIVE METABOLIC PANEL
ALT: 23 U/L (ref 0–44)
AST: 29 U/L (ref 15–41)
Albumin: 3.9 g/dL (ref 3.5–5.0)
Alkaline Phosphatase: 55 U/L (ref 38–126)
Anion gap: 10 (ref 5–15)
BUN: 12 mg/dL (ref 6–20)
CO2: 23 mmol/L (ref 22–32)
Calcium: 9.2 mg/dL (ref 8.9–10.3)
Chloride: 103 mmol/L (ref 98–111)
Creatinine, Ser: 1 mg/dL (ref 0.44–1.00)
GFR, Estimated: >60 mL/min (ref >60)
Glucose, Bld: 127 mg/dL — ABNORMAL HIGH (ref 70–99)
Potassium: 4 mmol/L (ref 3.5–5.1)
Sodium: 136 mmol/L (ref 135–145)
Total Bilirubin: 1.1 mg/dL (ref 0.3–1.2)
Total Protein: 8.1 g/dL (ref 6.5–8.1)

## 2022-06-22 LAB — CBC WITH DIFFERENTIAL/PLATELET
Abs Immature Granulocytes: 0.02 10*3/uL (ref 0.00–0.07)
Basophils Absolute: 0 10*3/uL (ref 0.0–0.1)
Basophils Relative: 0 %
Eosinophils Absolute: 0.1 10*3/uL (ref 0.0–0.5)
Eosinophils Relative: 1 %
HCT: 43.4 % (ref 36.0–46.0)
Hemoglobin: 14.5 g/dL (ref 12.0–15.0)
Immature Granulocytes: 0 %
Lymphocytes Relative: 7 %
Lymphs Abs: 0.5 10*3/uL — ABNORMAL LOW (ref 0.7–4.0)
MCH: 31 pg (ref 26.0–34.0)
MCHC: 33.4 g/dL (ref 30.0–36.0)
MCV: 92.9 fL (ref 80.0–100.0)
Monocytes Absolute: 0.1 10*3/uL (ref 0.1–1.0)
Monocytes Relative: 1 %
Neutro Abs: 6.2 10*3/uL (ref 1.7–7.7)
Neutrophils Relative %: 91 %
Platelets: 247 10*3/uL (ref 150–400)
RBC: 4.67 MIL/uL (ref 3.87–5.11)
RDW: 12.8 % (ref 11.5–15.5)
WBC: 6.8 10*3/uL (ref 4.0–10.5)
nRBC: 0 % (ref 0.0–0.2)

## 2022-06-22 LAB — LIPASE, BLOOD: Lipase: 25 U/L (ref 11–51)

## 2022-06-22 LAB — URINALYSIS, MICROSCOPIC (REFLEX)

## 2022-06-22 LAB — LACTIC ACID, PLASMA: Lactic Acid, Venous: 1.8 mmol/L (ref 0.5–1.9)

## 2022-06-22 LAB — SARS CORONAVIRUS 2 BY RT PCR: SARS Coronavirus 2 by RT PCR: NEGATIVE

## 2022-06-22 LAB — PREGNANCY, URINE: Preg Test, Ur: NEGATIVE

## 2022-06-22 MED ORDER — ACETAMINOPHEN 500 MG PO TABS: 1000.0000 mg | ORAL_TABLET | Freq: Four times a day (QID) | ORAL | Status: DC | PRN

## 2022-06-22 MED ORDER — MORPHINE SULFATE (PF) 4 MG/ML IV SOLN
4.0000 mg | INTRAVENOUS | Status: DC | PRN
  Administered 2022-06-22: 4 mg via INTRAVENOUS
  Filled 2022-06-22: qty 1

## 2022-06-22 MED ORDER — CIPROFLOXACIN IN D5W 400 MG/200ML IV SOLN
400.0000 mg | Freq: Once | INTRAVENOUS | Status: AC
  Administered 2022-06-22: 400 mg via INTRAVENOUS
  Filled 2022-06-22: qty 200

## 2022-06-22 MED ORDER — SODIUM CHLORIDE 0.9 % IV SOLN
2.0000 g | INTRAVENOUS | Status: DC
  Administered 2022-06-22 – 2022-06-24 (×3): 2 g via INTRAVENOUS
  Filled 2022-06-22 (×4): qty 20

## 2022-06-22 MED ORDER — LACTATED RINGERS IV BOLUS
1000.0000 mL | Freq: Once | INTRAVENOUS | Status: AC
  Administered 2022-06-22: 1000 mL via INTRAVENOUS

## 2022-06-22 MED ORDER — ACETAMINOPHEN 500 MG PO TABS
1000.0000 mg | ORAL_TABLET | Freq: Three times a day (TID) | ORAL | Status: DC | PRN
  Administered 2022-06-22 – 2022-06-23 (×3): 1000 mg via ORAL
  Filled 2022-06-22 (×3): qty 2

## 2022-06-22 MED ORDER — ACETAMINOPHEN 10 MG/ML IV SOLN: 1000.0000 mg | Freq: Once | INTRAVENOUS | Status: DC

## 2022-06-22 MED ORDER — METRONIDAZOLE 500 MG/100ML IV SOLN
500.0000 mg | Freq: Two times a day (BID) | INTRAVENOUS | Status: DC
  Administered 2022-06-23 – 2022-06-25 (×5): 500 mg via INTRAVENOUS
  Filled 2022-06-22 (×5): qty 100

## 2022-06-22 MED ORDER — ONDANSETRON HCL 4 MG/2ML IJ SOLN
4.0000 mg | Freq: Three times a day (TID) | INTRAMUSCULAR | Status: DC | PRN
  Administered 2022-06-22: 4 mg via INTRAVENOUS
  Filled 2022-06-22: qty 2

## 2022-06-22 MED ORDER — MORPHINE SULFATE (PF) 2 MG/ML IV SOLN
2.0000 mg | INTRAVENOUS | Status: DC | PRN
  Administered 2022-06-23 (×2): 2 mg via INTRAVENOUS
  Filled 2022-06-22 (×2): qty 1

## 2022-06-22 MED ORDER — ENOXAPARIN SODIUM 80 MG/0.8ML IJ SOSY
70.0000 mg | PREFILLED_SYRINGE | INTRAMUSCULAR | Status: DC
  Administered 2022-06-22: 70 mg via SUBCUTANEOUS
  Filled 2022-06-22 (×2): qty 0.8

## 2022-06-22 MED ORDER — IOHEXOL 300 MG/ML  SOLN
100.0000 mL | Freq: Once | INTRAMUSCULAR | Status: AC | PRN
  Administered 2022-06-22: 100 mL via INTRAVENOUS

## 2022-06-22 MED ORDER — METRONIDAZOLE 500 MG/100ML IV SOLN
500.0000 mg | Freq: Three times a day (TID) | INTRAVENOUS | Status: DC
  Administered 2022-06-22 (×2): 500 mg via INTRAVENOUS
  Filled 2022-06-22 (×2): qty 100

## 2022-06-22 NOTE — ED Provider Notes (Addendum)
[facility] Provider Note   CSN: [id number] Arrival date & time: 06/22/22  8937     History  Chief Complaint  Patient presents with   Abdominal Pain    Patricia Burgess is a 56 y.o. female with history of fibroids, bilateral sacroiliitis, severe obesity, T2DM not on insulin presents with abdominal pain.   Abdominal pain that started on Sunday 10/1. Started in suprapubic midline region and has migrated to the left lower quadrant.  Worse with movement. Positive nausea, no vomiting.  Denies any chest pain, dysuria/hematuria, diarrhea/constipation, hematochezia/melena, vaginal symptoms including bleeding.  Endorses SOB w/ pain and climbing stairs. No history of similar pain.  Has a history of a C-section and a fibroid embolization remotely.  No sick contacts.  Has not noticed any fever at home.   On presentation to the emergency department, patient is tachycardic into the 130s, febrile at 103.3 Fahrenheit.  Normotensive.  HPI     Home Medications Prior to Admission medications   Medication Sig Start Date End Date Taking? Authorizing Provider  methocarbamol (ROBAXIN) 500 MG tablet Take 1 tablet (500 mg total) by mouth 2 (two) times daily. 05/23/21   Suzy Bouchard, PA-C  orphenadrine (NORFLEX) 100 MG tablet Take 1 tablet (100 mg total) by mouth 2 (two) times daily as needed for muscle spasms (pain). 04/10/17   Clayton Bibles, PA-C  tirzepatide [facility]) 2.5 MG/0.5ML Pen Inject 2.'5mg'$  under the skin once a week for 4 weeks 12/16/21     tirzepatide [facility]) 2.5 MG/0.5ML Pen Inject 2.'5mg'$  under the skin once weekly for 4 weeks 12/16/21         Allergies    Penicillins    Review of Systems   Review of Systems Review of systems negative for CP.  A 10 point review of systems was performed and is negative unless otherwise reported in HPI.  Physical Exam Updated Vital Signs BP (!) 160/84   Pulse (!) 111   Temp (!) 101.3 F (38.5 C) (Oral)   Resp 20   Ht 5'  5" (1.651 m)   Wt (!) 145.2 kg   SpO2 95%   BMI 53.25 kg/m  Physical Exam General: Uncomfortable-appearing female, lying in bed.  HEENT: Sclera anicteric, MMM, trachea midline. Cardiology: Regular tachycardic rate, no murmurs/rubs/gallops. BL radial and DP pulses equal bilaterally.  Resp: Normal respiratory rate and effort. CTAB, no wheezes, rhonchi, crackles.  Abd: Obese.  Soft, nondistended.  Tender to palpation in the left lower quadrant with positive rebound tenderness.  No guarding.  GU: Deferred. MSK: No peripheral edema or signs of trauma. Extremities without deformity or TTP. No cyanosis or clubbing. Skin: warm, dry. No rashes or lesions. Neuro: A&Ox4, CNs II-XII grossly intact. MAEs. Sensation grossly intact.  Psych: Normal mood and affect.   ED Results / Procedures / Treatments   Labs (all labs ordered are listed, but only abnormal results are displayed) Labs Reviewed  COMPREHENSIVE METABOLIC PANEL - Abnormal; Notable for the following components:      Result Value   Glucose, Bld 127 (*)    All other components within normal limits  CBC WITH DIFFERENTIAL/PLATELET - Abnormal; Notable for the following components:   Lymphs Abs 0.5 (*)    All other components within normal limits  URINALYSIS, ROUTINE W REFLEX MICROSCOPIC - Abnormal; Notable for the following components:   Hgb urine dipstick TRACE (*)    Leukocytes,Ua TRACE (*)    All other components within normal limits  URINALYSIS, MICROSCOPIC (REFLEX) -  Abnormal; Notable for the following components:   Bacteria, UA MANY (*)    Non Squamous Epithelial PRESENT (*)    All other components within normal limits  CBG MONITORING, ED - Abnormal; Notable for the following components:   Glucose-Capillary 121 (*)    All other components within normal limits  SARS CORONAVIRUS 2 BY RT PCR  CULTURE, BLOOD (ROUTINE X 2)  CULTURE, BLOOD (ROUTINE X 2)  LACTIC ACID, PLASMA  PROTIME-INR  PREGNANCY, URINE  LIPASE, BLOOD  LACTIC  ACID, PLASMA    EKG EKG Interpretation  Date/Time:  Thursday June 22 2022 07:54:45 EDT Ventricular Rate:  119 PR Interval:  165 QRS Duration: 87 QT Interval:  289 QTC Calculation: 407 R Axis:   59 Text Interpretation: Sinus tachycardia Confirmed by Cindee Lame [phone]) on 06/22/2022 9:51:21 AM  Radiology CT ABDOMEN PELVIS W CONTRAST  Result Date: 06/22/2022 CLINICAL DATA:  Left lower quadrant abdominal pain with nausea vomiting. EXAM: CT ABDOMEN AND PELVIS WITH CONTRAST TECHNIQUE: Multidetector CT imaging of the abdomen and pelvis was performed using the standard protocol following bolus administration of intravenous contrast. RADIATION DOSE REDUCTION: This exam was performed according to the departmental dose-optimization program which includes automated exposure control, adjustment of the mA and/or kV according to patient size and/or use of iterative reconstruction technique. CONTRAST:  139m OMNIPAQUE IOHEXOL 300 MG/ML  SOLN COMPARISON:  None Available. FINDINGS: Lower chest: Unremarkable. Hepatobiliary: No suspicious focal abnormality within the liver parenchyma. Gallbladder surgically absent. No intrahepatic or extrahepatic biliary dilation. Pancreas: No focal mass lesion. No dilatation of the main duct. No intraparenchymal cyst. No peripancreatic edema. Spleen: No splenomegaly. No focal mass lesion. Adrenals/Urinary Tract: No adrenal nodule or mass. Kidneys unremarkable. No evidence for hydroureter. The urinary bladder appears normal for the degree of distention. Stomach is unremarkable. No gastric wall thickening. No evidence of outlet obstruction. Stomach/Bowel: Stomach is unremarkable. No gastric wall thickening. No evidence of outlet obstruction. Duodenum is normally positioned as is the ligament of Treitz. No small bowel wall thickening. No small bowel dilatation. The terminal ileum is normal. The appendix is normal. No gross colonic mass. No colonic wall thickening. Diffuse  diverticular disease evident. Ill-defined diverticulum in the distal descending colon has surrounding edema/inflammation (image 65/2 and coronal 76/5) consistent with diverticulitis. No evidence of perforation or abscess. Vascular/Lymphatic: No abdominal aortic aneurysm. There is no gastrohepatic or hepatoduodenal ligament lymphadenopathy. No retroperitoneal or mesenteric lymphadenopathy. No pelvic sidewall lymphadenopathy. Reproductive: Multiple calcified uterine fibroids evident. There is no adnexal mass. Other: No intraperitoneal free fluid. Musculoskeletal: No worrisome lytic or sclerotic osseous abnormality. IMPRESSION: 1. Diffuse diverticular disease with acute diverticulitis in the distal descending colon. No evidence of perforation or abscess. 2. Fibroid uterus. 3. Status post cholecystectomy. Electronically Signed   By: EMisty StanleyM.D.   On: 06/22/2022 09:03   DG Chest Port 1 View  Result Date: 06/22/2022 CLINICAL DATA:  Possible sepsis, abdominal pain EXAM: PORTABLE CHEST 1 VIEW COMPARISON:  05/23/2021 FINDINGS: Transverse diameter of heart is increased. Central pulmonary vessels are prominent. There are no signs of pulmonary edema. There are no new focal infiltrates. There is no pleural effusion or pneumothorax. IMPRESSION: Cardiomegaly. There are no signs of pulmonary edema or focal pulmonary consolidation. Electronically Signed   By: PElmer PickerM.D.   On: 06/22/2022 08:34    Procedures Procedures    Medications Ordered in ED Medications  ondansetron (ZOFRAN) injection 4 mg (has no administration in time range)  morphine (PF) 4 MG/ML injection 4  mg (has no administration in time range)  ciprofloxacin (CIPRO) IVPB 400 mg (400 mg Intravenous New Bag/Given 06/22/22 0926)  metroNIDAZOLE (FLAGYL) IVPB 500 mg (has no administration in time range)  acetaminophen (TYLENOL) tablet 1,000 mg (1,000 mg Oral Given 06/22/22 0946)  lactated ringers bolus 1,000 mL (0 mLs Intravenous Stopped  06/22/22 0845)  iohexol (OMNIPAQUE) 300 MG/ML solution 100 mL (100 mLs Intravenous Contrast Given 06/22/22 0845)  lactated ringers bolus 1,000 mL (1,000 mLs Intravenous New Bag/Given 06/22/22 8882)    ED Course/ Medical Decision Making/ A&P                          Medical Decision Making Amount and/or Complexity of Data Reviewed Labs: ordered. Decision-making details documented in ED Course. Radiology: ordered. Decision-making details documented in ED Course.  Risk OTC drugs. Prescription drug management. Decision regarding hospitalization.   For DDX for abdominal pain includes but is not limited to:  Abdominal exam with rebound tenderness and focal LLQ pain, concerning for peritoneal signs. Given location, highest c/f diverticulitis, UTI/pyelonephritis, acute mesenteric ischemia. Also consider pancreatitis, appendicitis, or cholecystitis but lower suspicion given no RUQ/RLQ/epigastric pain. Also consider gynecologic pathology such as ovarian torsion. Given clinical picture, lower suspicion for SBO/ileus or aortic pathology.  Will obtain labs and CT imaging. Will give LR, zofran, morphine.   I have personally reviewed and interpreted all labs and imaging.   Clinical Course as of 06/22/22 0949  Thu Jun 22, 2022  0814 CBC with Differential(!) wnl [HN]  0814 Glucose-Capillary(!): 121 [HN]  0829 Lactic Acid, Venous: 1.8 [HN]  0829 Preg Test, Ur: NEGATIVE [HN]  0915 Comprehensive metabolic panel(!) Unremarkable [HN]  0915 CT ABDOMEN PELVIS W CONTRAST 1. Diffuse diverticular disease with acute diverticulitis in the distal descending colon. No evidence of perforation or abscess. 2. Fibroid uterus. 3. Status post cholecystectomy. [HN]  [id number] CT abd pelvis demonstrates evidence of diverticulitis.  Patient is penicillin allergic, treated with ciprofloxacin and metronidazole IV.  Patient has received 1 L of fluid, temp 101.3 Fahrenheit, heart rate 111.  No white count but now with source of  infection patient meets sepsis criteria.  Blood cultures in process. Consulted to surgery. [HN]  0940 D/w surgery who defers to medicine. Consulted for admission. [HN]  [id number] Patient admitted to [facility] long. Starting 2nd L of LR. [HN]    Clinical Course User Index [HN] Audley Hose, MD             Final Clinical Impression(s) / ED Diagnoses Final diagnoses:  Diverticulitis    Rx / DC Orders ED Discharge Orders     None        This note was created using dictation software, which may contain spelling or grammatical errors.    Audley Hose, MD 06/22/22 [phone]

## 2022-06-22 NOTE — ED Notes (Signed)
[facility] at bedside 

## 2022-06-22 NOTE — Progress Notes (Signed)
Plan of Care Note for accepted transfer   Patient: Patricia Burgess MRN: [id number]   DOA: 06/22/2022  Facility requesting transfer: [facility].  Requesting Provider: Rushie Goltz MD. Reason for transfer: Acute diverticulitis. Facility course:  Per EDP: " Chief Complaint  Patient presents with   Abdominal Pain    Cheree Fowles is a 56 y.o. female with history of fibroids, bilateral sacroiliitis, severe obesity, T2DM not on insulin presents with abdominal pain.    Abdominal pain that started on Sunday 10/1. Started in suprapubic midline region and has migrated to the left lower quadrant.  Worse with movement. Positive nausea, no vomiting.  Denies any chest pain, dysuria/hematuria, diarrhea/constipation, hematochezia/melena, vaginal symptoms including bleeding.  Endorses SOB w/ pain and climbing stairs. No history of similar pain.  Has a history of a C-section and a fibroid embolization remotely.  No sick contacts.  Has not noticed any fever at home.    On presentation to the emergency department, patient is tachycardic into the 130s, febrile at 103.3 Fahrenheit.  Normotensive."  Lab work: Culture, blood (Routine x 2) [[id number]]   Collected: 06/22/22 0750   Updated: 06/22/22 0937   Specimen Type: Blood   Specimen Source: Peripheral   SARS Coronavirus 2 by RT PCR (hospital order, performed in [facility] hospital lab) *cepheid single result test* Urine, Clean Catch [[id number]]   Collected: 06/22/22 0744   Updated: 06/22/22 0855   Specimen Type: Nasal Swab   Specimen Source: Urine, Clean Catch    SARS Coronavirus 2 by RT PCR NEGATIVE  Urinalysis, Microscopic (reflex) [[id number]] (Abnormal)   Collected: 06/22/22 0730   Updated: 06/22/22 0841    RBC / HPF 0-5 RBC/hpf   WBC, UA 6-10 WBC/hpf   Bacteria, UA MANY Abnormal    Squamous Epithelial / LPF 6-10   Non Squamous Epithelial PRESENT Abnormal    Hyaline Casts, UA PRESENT  Urinalysis, Routine w reflex microscopic Urine, Clean  Catch [[id number]] (Abnormal)   Collected: 06/22/22 0730   Updated: 06/22/22 0841   Specimen Source: Urine, Clean Catch    Color, Urine YELLOW   APPearance CLEAR   Specific Gravity, Urine 1.015   pH 5.5   Glucose, UA NEGATIVE mg/dL   Hgb urine dipstick TRACE Abnormal    Bilirubin Urine NEGATIVE   Ketones, ur NEGATIVE mg/dL   Protein, ur NEGATIVE mg/dL   Nitrite NEGATIVE   Leukocytes,Ua TRACE Abnormal   Comprehensive metabolic panel [[id number]] (Abnormal)   Collected: 06/22/22 0745   Updated: 06/22/22 0833   Specimen Type: Blood   Specimen Source: Vein    Sodium 136 mmol/L   Potassium 4.0 mmol/L   Chloride 103 mmol/L   CO2 23 mmol/L   Glucose, Bld 127 High  mg/dL   BUN 12 mg/dL   Creatinine, Ser 1.00 mg/dL   Calcium 9.2 mg/dL   Total Protein 8.1 g/dL   Albumin 3.9 g/dL   AST 29 U/L   ALT 23 U/L   Alkaline Phosphatase 55 U/L   Total Bilirubin 1.1 mg/dL   GFR, Estimated >60 mL/min   Anion gap 10  Lipase, blood [[id number]]   Collected: 06/22/22 0745   Updated: 06/22/22 0833   Specimen Type: Blood   Specimen Source: Vein    Lipase 25 U/L  Pregnancy, urine [[id number]]   Collected: 06/22/22 0730   Updated: 06/22/22 0829   Specimen Source: Urine, Clean Catch    Preg Test, Ur NEGATIVE  Lactic acid, plasma [[id number]]   Collected: 06/22/22 0745   Updated:  06/22/22 0818   Specimen Type: Blood   Specimen Source: Vein    Lactic Acid, Venous 1.8 mmol/L  Protime-INR [[id number]]   Collected: 06/22/22 0745   Updated: 06/22/22 0814   Specimen Type: Blood   Specimen Source: Vein    Prothrombin Time 14.0 seconds   INR 1.1  CBC with Differential [[id number]] (Abnormal)   Collected: 06/22/22 0745   Updated: 06/22/22 0806   Specimen Type: Blood   Specimen Source: Vein    WBC 6.8 K/uL   RBC 4.67 MIL/uL   Hemoglobin 14.5 g/dL   HCT 43.4 %   MCV 92.9 fL   MCH 31.0 pg   MCHC 33.4 g/dL   RDW 12.8 %   Platelets 247 K/uL   nRBC 0.0 %   Neutrophils Relative % 91 %   Neutro  Abs 6.2 K/uL   Lymphocytes Relative 7 %   Lymphs Abs 0.5 Low  K/uL   Monocytes Relative 1 %   Monocytes Absolute 0.1 K/uL   Eosinophils Relative 1 %   Eosinophils Absolute 0.1 K/uL   Basophils Relative 0 %   Basophils Absolute 0.0 K/uL   Immature Granulocytes 0 %   Abs Immature Granulocytes 0.02 K/uL  Culture, blood (Routine x 2) [[id number]]   Collected: 06/22/22 0740   Updated: 06/22/22 0757   Specimen Type: Blood   Specimen Source: Urine, Clean Catch   POC CBG, ED [[id number]] (Abnormal)   Collected: 06/22/22 0749   Updated: 06/22/22 0750   Specimen Type: Blood    Glucose-Capillary 121 High  mg/dL   Imaging:  CLINICAL DATA:  Left lower quadrant abdominal pain with nausea vomiting.   EXAM: CT ABDOMEN AND PELVIS WITH CONTRAST   TECHNIQUE: Multidetector CT imaging of the abdomen and pelvis was performed using the standard protocol following bolus administration of intravenous contrast.   RADIATION DOSE REDUCTION: This exam was performed according to the departmental dose-optimization program which includes automated exposure control, adjustment of the mA and/or kV according to patient size and/or use of iterative reconstruction technique.   CONTRAST:  162m OMNIPAQUE IOHEXOL 300 MG/ML  SOLN   COMPARISON:  None Available.   FINDINGS: Lower chest: Unremarkable.   Hepatobiliary: No suspicious focal abnormality within the liver parenchyma. Gallbladder surgically absent. No intrahepatic or extrahepatic biliary dilation.   Pancreas: No focal mass lesion. No dilatation of the main duct. No intraparenchymal cyst. No peripancreatic edema.   Spleen: No splenomegaly. No focal mass lesion.   Adrenals/Urinary Tract: No adrenal nodule or mass. Kidneys unremarkable. No evidence for hydroureter. The urinary bladder appears normal for the degree of distention. Stomach is unremarkable. No gastric wall thickening. No evidence of outlet obstruction.   Stomach/Bowel: Stomach is  unremarkable. No gastric wall thickening. No evidence of outlet obstruction. Duodenum is normally positioned as is the ligament of Treitz. No small bowel wall thickening. No small bowel dilatation. The terminal ileum is normal. The appendix is normal. No gross colonic mass. No colonic wall thickening. Diffuse diverticular disease evident. Ill-defined diverticulum in the distal descending colon has surrounding edema/inflammation (image 65/2 and coronal 76/5) consistent with diverticulitis. No evidence of perforation or abscess.   Vascular/Lymphatic: No abdominal aortic aneurysm. There is no gastrohepatic or hepatoduodenal ligament lymphadenopathy. No retroperitoneal or mesenteric lymphadenopathy. No pelvic sidewall lymphadenopathy.   Reproductive: Multiple calcified uterine fibroids evident. There is no adnexal mass.   Other: No intraperitoneal free fluid.   Musculoskeletal: No worrisome lytic or sclerotic osseous abnormality.   IMPRESSION: 1. Diffuse diverticular disease with  acute diverticulitis in the distal descending colon. No evidence of perforation or abscess. 2. Fibroid uterus. 3. Status post cholecystectomy.   Electronically Signed   By: Misty Stanley M.D.   On: 06/22/2022 09:03  Plan of care: The patient has received 2000 mL of IV fluid bolus, morphine, ciprofloxacin and metronidazole.  She has been  accepted for admission to [place]  unit, at [facility].  Author: Reubin Milan, MD 06/22/2022  Check [email or web address] for on-call coverage.  Nursing staff, Please call [place] number on Amion as soon as patient's arrival, so appropriate admitting provider can evaluate the pt.

## 2022-06-22 NOTE — Assessment & Plan Note (Signed)
Uncomplicated without any abscess or perforation. -continue IV Rocephin and Flagyl -full liquid diet and can advance as tolerated -PRN IV morphine pain control -recommend pt follow up with routine colonoscopy outpatient

## 2022-06-22 NOTE — Assessment & Plan Note (Signed)
Not adherent with CPAP

## 2022-06-22 NOTE — ED Notes (Signed)
ED TO INPATIENT HANDOFF REPORT  ED Nurse Name and Phone #: Baxter Flattery, RN  S Name/Age/Gender Patricia Burgess 56 y.o. female Room/Bed: MH02/MH02  Code Status   Code Status: Not on file  Home/SNF/Other Home Patient oriented to: self, place, time, and situation Is this baseline? Yes   Triage Complete: Triage complete  Chief Complaint Acute diverticulitis [K57.92]  Triage Note Abdominal pain that started on Sunday. Denies any vomiting. Fever in triage   Allergies Allergies  Allergen Reactions   Penicillins Other (See Comments)    Yeast infection     Level of Care/Admitting Diagnosis ED Disposition     ED Disposition  Admit   Condition  --   [place]: [facility] [[id number]] Level of Care: Med-Surg [16] May admit patient to [facility] or [facility] if equivalent level of care is available:: No Interfacility transfer: Yes Covid Evaluation: Asymptomatic - no recen t exposure (last 10 days) testing not required Diagnosis: Acute diverticulitis [[id number]] Admitting Physician: Reubin Milan [[id number]] Attending Physician: Reubin Milan [[id number]] Certification:: I certify this patient will need inpati ent services for at least 2 midnights Estimated Length of Stay: 2          B Medical/Surgery History Past Medical History:  Diagnosis Date   Fibroids    Sleep apnea    Past Surgical History:  Procedure Laterality Date   CESAREAN SECTION     CHOLECYSTECTOMY     IR GENERIC HISTORICAL  10/08/2014   IR RADIOLOGIST EVAL & MGMT 10/08/2014 Sandi Mariscal, MD [facility] INTERV RAD   UTERINE FIBROID EMBOLIZATION       A IV Location/Drains/Wounds Patient Lines/Drains/Airways Status     Active Line/Drains/Airways     Name Placement date Placement time Site Days   Peripheral IV 06/22/22 20 G 1" Left Antecubital 06/22/22  0744  Antecubital  less than 1   Peripheral IV 06/22/22 20 G 1" Posterior;Right Hand 06/22/22  0748  Hand  less than 1             Intake/Output Last 24 hours No intake or output data in the 24 hours ending 06/22/22 1541  Labs/Imaging Results for orders placed or performed during the hospital encounter of 06/22/22 (from the past 48 hour(s))  Urinalysis, Routine w reflex microscopic Urine, Clean Catch     Status: Abnormal   Collection Time: 06/22/22  7:30 AM  Result Value Ref Range   Color, Urine YELLOW YELLOW   APPearance CLEAR CLEAR   Specific Gravity, Urine 1.015 1.005 - 1.030   pH 5.5 5.0 - 8.0   Glucose, UA NEGATIVE NEGATIVE mg/dL   Hgb urine dipstick TRACE (A) NEGATIVE   Bilirubin Urine NEGATIVE NEGATIVE   Ketones, ur NEGATIVE NEGATIVE mg/dL   Protein, ur NEGATIVE NEGATIVE mg/dL   Nitrite NEGATIVE NEGATIVE   Leukocytes,Ua TRACE (A) NEGATIVE    Comment: Performed at [facility] [address] [place], [place] [address]  Pregnancy, urine     Status: None   Collection Time: 06/22/22  7:30 AM  Result Value Ref Range   Preg Test, Ur NEGATIVE NEGATIVE    Comment:        THE SENSITIVITY OF THIS METHODOLOGY IS >20 mIU/mL. Performed at [facility], [address] [place], [place] [address]   Urinalysis, Microscopic (reflex)     Status: Abnormal   Collection Time: 06/22/22  7:30 AM  Result Value Ref Range   RBC / HPF  0-5 0 - 5 RBC/hpf   WBC, UA 6-10 0 - 5 WBC/hpf   Bacteria, UA MANY (A) NONE SEEN   Squamous Epithelial / LPF 6-10 0 - 5   Non Squamous Epithelial PRESENT (A) NONE SEEN   Hyaline Casts, UA PRESENT     Comment: Performed at [facility], [address] [place], [place] [address]  SARS Coronavirus 2 by RT PCR (hospital order, performed in [facility] hospital lab) *cepheid single result test* Urine, Clean Catch     Status: None   Collection Time: 06/22/22  7:44 AM   Specimen: Urine, Clean Catch; Nasal Swab  Result Value Ref Range   SARS Coronavirus 2 by RT PCR NEGATIVE NEGATIVE    Comment: (NOTE) SARS-CoV-2 target nucleic acids are NOT  DETECTED.  The SARS-CoV-2 RNA is generally detectable in upper and lower respiratory specimens during the acute phase of infection. The lowest concentration of SARS-CoV-2 viral copies this assay can detect is 250 copies / mL. A negative result does not preclude SARS-CoV-2 infection and should not be used as the sole basis for treatment or other patient management decisions.  A negative result may occur with improper specimen collection / handling, submission of specimen other than nasopharyngeal swab, presence of viral mutation(s) within the areas targeted by this assay, and inadequate number of viral copies (<250 copies / mL). A negative result must be combined with clinical observations, patient history, and epidemiological information.  Fact Sheet for Patients:   https://www.patel.info/  Fact Sheet for Healthcare Providers: https://hall.com/  This test is not yet approved or  cleared by the [place] FDA and has been authorized for detection and/or diagnosis of SARS-CoV-2 by FDA under an Emergency Use Authorization (EUA).  This EUA will remain in effect (meaning this test can be used) for the duration of the COVID-19 declaration under Section 564(b)(1) of the Act, 21 U.S.C. section 360bbb-3(b)(1), unless the authorization is terminated or revoked sooner.  Performed at [facility], [address] [place], [place] [address]   Comprehensive metabolic panel     Status: Abnormal   Collection Time: 06/22/22  7:45 AM  Result Value Ref Range   Sodium 136 135 - 145 mmol/L   Potassium 4.0 3.5 - 5.1 mmol/L   Chloride 103 98 - 111 mmol/L   CO2 23 22 - 32 mmol/L   Glucose, Bld 127 (H) 70 - 99 mg/dL    Comment: Glucose reference range applies only to samples taken after fasting for at least 8 hours.   BUN 12 6 - 20 mg/dL   Creatinine, Ser 1.00 0.44 - 1.00 mg/dL   Calcium 9.2 8.9 - 10.3 mg/dL   Total Protein 8.1 6.5 - 8.1 g/dL    Albumin 3.9 3.5 - 5.0 g/dL   AST 29 15 - 41 U/L   ALT 23 0 - 44 U/L   Alkaline Phosphatase 55 38 - 126 U/L   Total Bilirubin 1.1 0.3 - 1.2 mg/dL   GFR, Estimated >60 >60 mL/min    Comment: (NOTE) Calculated using the CKD-EPI Creatinine Equation (2021)    Anion gap 10 5 - 15    Comment: Performed at [facility], [address] [place], [place] [address]  Lactic acid, plasma     Status: None   Collection Time: 06/22/22  7:45 AM  Result Value Ref Range   Lactic Acid, Venous 1.8 0.5 - 1.9 mmol/L    Comment: Performed at [facility], [address] [place], [place] [address]  CBC with Differential     Status: Abnormal   Collection Time: 06/22/22  7:45 AM  Result Value Ref Range   WBC 6.8 4.0 - 10.5 K/uL   RBC 4.67 3.87 - 5.11 MIL/uL   Hemoglobin 14.5 12.0 - 15.0 g/dL   HCT 43.4 36.0 - 46.0 %   MCV 92.9 80.0 - 100.0 fL   MCH 31.0 26.0 - 34.0 pg   MCHC 33.4 30.0 - 36.0 g/dL   RDW 12.8 11.5 - 15.5 %   Platelets 247 150 - 400 K/uL   nRBC 0.0 0.0 - 0.2 %   Neutrophils Relative % 91 %   Neutro Abs 6.2 1.7 - 7.7 K/uL   Lymphocytes Relative 7 %   Lymphs Abs 0.5 (L) 0.7 - 4.0 K/uL   Monocytes Relative 1 %   Monocytes Absolute 0.1 0.1 - 1.0 K/uL   Eosinophils Relative 1 %   Eosinophils Absolute 0.1 0.0 - 0.5 K/uL   Basophils Relative 0 %   Basophils Absolute 0.0 0.0 - 0.1 K/uL   Immature Granulocytes 0 %   Abs Immature Granulocytes 0.02 0.00 - 0.07 K/uL    Comment: Performed at [facility], [address] [place], [place] [address]  Protime-INR     Status: None   Collection Time: 06/22/22  7:45 AM  Result Value Ref Range   Prothrombin Time 14.0 11.4 - 15.2 seconds   INR 1.1 0.8 - 1.2    Comment: (NOTE) INR goal varies based on device and disease states. Performed at [facility], [address] [place], [place] [address]   Lipase, blood     Status: None   Collection Time: 06/22/22  7:45 AM  Result Value Ref Range   Lipase  25 11 - 51 U/L    Comment: Performed at [facility], [address] [place], [place] [address]  POC CBG, ED     Status: Abnormal   Collection Time: 06/22/22  7:49 AM  Result Value Ref Range   Glucose-Capillary 121 (H) 70 - 99 mg/dL    Comment: Glucose reference range applies only to samples taken after fasting for at least 8 hours.   CT ABDOMEN PELVIS W CONTRAST  Result Date: 06/22/2022 CLINICAL DATA:  Left lower quadrant abdominal pain with nausea vomiting. EXAM: CT ABDOMEN AND PELVIS WITH CONTRAST TECHNIQUE: Multidetector CT imaging of the abdomen and pelvis was performed using the standard protocol following bolus administration of intravenous contrast. RADIATION DOSE REDUCTION: This exam was performed according to the departmental dose-optimization program which includes automated exposure control, adjustment of the mA and/or kV according to patient size and/or use of iterative reconstruction technique. CONTRAST:  127m OMNIPAQUE IOHEXOL 300 MG/ML  SOLN COMPARISON:  None Available. FINDINGS: Lower chest: Unremarkable. Hepatobiliary: No suspicious focal abnormality within the liver parenchyma. Gallbladder surgically absent. No intrahepatic or extrahepatic biliary dilation. Pancreas: No focal mass lesion. No dilatation of the main duct. No intraparenchymal cyst. No peripancreatic edema. Spleen: No splenomegaly. No focal mass lesion. Adrenals/Urinary Tract: No adrenal nodule or mass. Kidneys unremarkable. No evidence for hydroureter. The urinary bladder appears normal for the degree of distention. Stomach is unremarkable. No gastric wall thickening. No evidence of outlet obstruction. Stomach/Bowel: Stomach is unremarkable. No gastric wall thickening. No evidence of outlet obstruction. Duodenum is normally positioned as is the ligament of Treitz. No small bowel wall thickening. No small bowel  dilatation. The terminal ileum is normal. The appendix is normal. No gross colonic mass. No colonic  wall thickening. Diffuse diverticular disease evident. Ill-defined diverticulum in the distal descending colon has surrounding edema/inflammation (image 65/2 and coronal 76/5) consistent with diverticulitis. No evidence of perforation or abscess. Vascular/Lymphatic: No abdominal aortic aneurysm. There is no gastrohepatic or hepatoduodenal ligament lymphadenopathy. No retroperitoneal or mesenteric lymphadenopathy. No pelvic sidewall lymphadenopathy. Reproductive: Multiple calcified uterine fibroids evident. There is no adnexal mass. Other: No intraperitoneal free fluid. Musculoskeletal: No worrisome lytic or sclerotic osseous abnormality. IMPRESSION: 1. Diffuse diverticular disease with acute diverticulitis in the distal descending colon. No evidence of perforation or abscess. 2. Fibroid uterus. 3. Status post cholecystectomy. Electronically Signed   By: Misty Stanley M.D.   On: 06/22/2022 09:03   DG Chest Port 1 View  Result Date: 06/22/2022 CLINICAL DATA:  Possible sepsis, abdominal pain EXAM: PORTABLE CHEST 1 VIEW COMPARISON:  05/23/2021 FINDINGS: Transverse diameter of heart is increased. Central pulmonary vessels are prominent. There are no signs of pulmonary edema. There are no new focal infiltrates. There is no pleural effusion or pneumothorax. IMPRESSION: Cardiomegaly. There are no signs of pulmonary edema or focal pulmonary consolidation. Electronically Signed   By: Elmer Picker M.D.   On: 06/22/2022 08:34    Pending Labs Unresulted Labs (From admission, onward)     Start     Ordered   06/22/22 0750  Lactic acid, plasma  Now then every 2 hours,   STAT        06/22/22 0938   06/22/22 0732  Culture, blood (Routine x 2)  BLOOD CULTURE X 2,   STAT      06/22/22 0731            Vitals/Pain Today's Vitals   06/22/22 1130 06/22/22 1200 06/22/22 1230 06/22/22 1445  BP: 114/70 111/65 106/62 125/68  Pulse: (!) 109 (!) 104 (!) 105 91  Resp: '18 18 18 18  '$ Temp: 99.2 F (37.3 C)   99.2  F (37.3 C)  TempSrc: Oral     SpO2: 93% 93% 94% 98%  Weight:      Height:      PainSc:        Isolation Precautions No active isolations  Medications Medications  ondansetron (ZOFRAN) injection 4 mg (has no administration in time range)  morphine (PF) 4 MG/ML injection 4 mg (has no administration in time range)  metroNIDAZOLE (FLAGYL) IVPB 500 mg (0 mg Intravenous Stopped 06/22/22 1231)  acetaminophen (TYLENOL) tablet 1,000 mg (1,000 mg Oral Given 06/22/22 0946)  lactated ringers bolus 1,000 mL (0 mLs Intravenous Stopped 06/22/22 0845)  iohexol (OMNIPAQUE) 300 MG/ML solution 100 mL (100 mLs Intravenous Contrast Given 06/22/22 0845)  ciprofloxacin (CIPRO) IVPB 400 mg (0 mg Intravenous Stopped 06/22/22 1142)  lactated ringers bolus 1,000 mL (0 mLs Intravenous Stopped 06/22/22 1231)    Mobility walks Low fall risk   Focused Assessments Cardiac Assessment Handoff:    Lab Results  Component Value Date   TROPONINI <0.30 09/09/2012   Lab Results  Component Value Date   DDIMER 0.29 09/09/2012   Does the Patient currently have chest pain? No    R Recommendations: See Admitting Provider Note  Report given to:   Additional Notes:

## 2022-06-22 NOTE — Assessment & Plan Note (Signed)
Recommend weight loss.  

## 2022-06-22 NOTE — Plan of Care (Signed)

## 2022-06-22 NOTE — H&P (Signed)
History and Physical    Patient: Patricia Burgess YYT:[id number] DOB: 05-01-1966 DOA: 06/22/2022 DOS: the patient was seen and examined on 06/22/2022 PCP: Erby Pian, PA-C  Patient coming from:  [facility] ED  Chief Complaint:  Chief Complaint  Patient presents with   Abdominal Pain   HPI: Patricia Burgess is a 56 y.o. female with medical history significant of OSA not on CPAP, morbid obesity, HLD, pre-diabetes, uterine fibroids who presented from outside ED for abdominal pain and found to have acute diverticulitis.  She started to have suprapubic abdominal pain about 4 days ago that initially improved but later started radiating to her left lower quadrant.  Also had fevers.  Denies any dysuria, increased urgency or frequency.  No nausea, vomiting diarrhea or constipation.  No recent abdominal surgeries.  Patient has never had a colonoscopy.  In the ED, she was febrile up to 103.64F, tachycardic with heart rate of 130s, normotensive.  No leukocytosis.  Lactate within normal limits.  LFTs are normal.  Normal lipase. UA with trace leukocyte, negative nitrite, many bacteria, 6-10 W BC but also has 6-10 squamous epithelium.  CT of the abdomen and pelvis showing diffuse diverticular disease with acute diverticulitis in the distal descending.  No evidence of perforation or abscess.  Review of Systems: As mentioned in the history of present illness. All other systems reviewed and are negative. Past Medical History:  Diagnosis Date   Fibroids    Sleep apnea    Past Surgical History:  Procedure Laterality Date   CESAREAN SECTION     CHOLECYSTECTOMY     IR GENERIC HISTORICAL  10/08/2014   IR RADIOLOGIST EVAL & MGMT 10/08/2014 Sandi Mariscal, MD [facility] INTERV RAD   UTERINE FIBROID EMBOLIZATION     Social History:  reports that she has never smoked. She has never used smokeless tobacco. She reports that she does not drink alcohol and does not use drugs.  Allergies  Allergen Reactions   Penicillins  Other (See Comments)    Yeast infection     History reviewed. No pertinent family history.  Prior to Admission medications   Medication Sig Start Date End Date Taking? Authorizing Provider  methocarbamol (ROBAXIN) 500 MG tablet Take 1 tablet (500 mg total) by mouth 2 (two) times daily. 05/23/21   Suzy Bouchard, PA-C  orphenadrine (NORFLEX) 100 MG tablet Take 1 tablet (100 mg total) by mouth 2 (two) times daily as needed for muscle spasms (pain). 04/10/17   Clayton Bibles, PA-C  tirzepatide [facility]) 2.5 MG/0.5ML Pen Inject 2.'5mg'$  under the skin once a week for 4 weeks 12/16/21     tirzepatide [facility]) 2.5 MG/0.5ML Pen Inject 2.'5mg'$  under the skin once weekly for 4 weeks 12/16/21       Physical Exam: Vitals:   06/22/22 1530 06/22/22 1600 06/22/22 1722 06/22/22 1833  BP: 135/75 122/76  137/75  Pulse: 85 94  87  Resp: '18 18  18  '$ Temp:   98.4 F (36.9 C) 98.9 F (37.2 C)  TempSrc:   Oral Oral  SpO2: 96% 97%  99%  Weight:      Height:       Constitutional: NAD, calm, comfortable, morbidly obese female sitting upright eating [place] ice Eyes: lids and conjunctivae normal ENMT: Mucous membranes are moist.  Neck: normal, supple Respiratory: clear to auscultation bilaterally, no wheezing, no crackles. Normal respiratory effort. No accessory muscle use.  Cardiovascular: Regular rate and rhythm, no murmurs / rubs / gallops. No extremity edema.  Abdomen: Soft,  nondistended, no tenderness, no rebound guarding or rigidity. Bowel sounds positive.  Musculoskeletal: no clubbing / cyanosis. No joint deformity upper and lower extremities. Good ROM, no contractures. Normal muscle tone.  Skin: no rashes, lesions, ulcers. No induration Neurologic: CN 2-12 grossly intact. Strength 5/5 in all 4.  Psychiatric: Normal judgment and insight. Alert and oriented x 3. Normal mood. Data Reviewed:  See HPI   Assessment and Plan: * Acute diverticulitis Uncomplicated without any abscess or  perforation. -continue IV Rocephin and Flagyl -full liquid diet and can advance as tolerated -PRN IV morphine pain control -recommend pt follow up with routine colonoscopy outpatient   OSA (obstructive sleep apnea) Not adherent with CPAP  Pre-diabetes Last HbA1C of 5.9 on 03/2022  BMI 50.0-59.9, adult ([place]) Recommend weight loss      Advance Care Planning:   Code Status: Full Code   Consults: none  Family Communication: Discussed with sisters at bedside  Severity of Illness: The appropriate patient status for this patient is INPATIENT. Inpatient status is judged to be reasonable and necessary in order to provide the required intensity of service to ensure the patient's safety. The patient's presenting symptoms, physical exam findings, and initial radiographic and laboratory data in the context of their chronic comorbidities is felt to place them at high risk for further clinical deterioration. Furthermore, it is not anticipated that the patient will be medically stable for discharge from the hospital within 2 midnights of admission.   * I certify that at the point of admission it is my clinical judgment that the patient will require inpatient hospital care spanning beyond 2 midnights from the point of admission due to high intensity of service, high risk for further deterioration and high frequency of surveillance required.*  Author: Orene Desanctis, DO 06/22/2022 7:54 PM  For on call review [email or web address].

## 2022-06-22 NOTE — ED Notes (Signed)
Report given to [facility]  

## 2022-06-22 NOTE — Assessment & Plan Note (Signed)
Last HbA1C of 5.9 on 03/2022

## 2022-06-22 NOTE — ED Notes (Signed)
XRAY CALLED FOR PORTABLE

## 2022-06-22 NOTE — ED Triage Notes (Signed)
Abdominal pain that started on Sunday. Denies any vomiting. Fever in triage

## 2022-06-23 LAB — BLOOD CULTURE ID PANEL (REFLEXED) - BCID2

## 2022-06-23 LAB — HIV ANTIBODY (ROUTINE TESTING W REFLEX): HIV Screen 4th Generation wRfx: NONREACTIVE

## 2022-06-23 LAB — BASIC METABOLIC PANEL
Anion gap: 8 (ref 5–15)
BUN: 10 mg/dL (ref 6–20)
CO2: 26 mmol/L (ref 22–32)
Calcium: 8.8 mg/dL — ABNORMAL LOW (ref 8.9–10.3)
Chloride: 103 mmol/L (ref 98–111)
Creatinine, Ser: 0.97 mg/dL (ref 0.44–1.00)
GFR, Estimated: >60 mL/min (ref >60)
Glucose, Bld: 99 mg/dL (ref 70–99)
Potassium: 4.3 mmol/L (ref 3.5–5.1)
Sodium: 137 mmol/L (ref 135–145)

## 2022-06-23 MED ORDER — SODIUM CHLORIDE 0.9 % IV SOLN: INTRAVENOUS | Status: DC

## 2022-06-23 NOTE — Plan of Care (Signed)

## 2022-06-23 NOTE — Plan of Care (Signed)
  Problem: Education: Goal: Knowledge of General Education information will improve Description: Including pain rating scale, medication(s)/side effects and non-pharmacologic comfort measures Outcome: Progressing   Problem: Pain Managment: Goal: General experience of comfort will improve Outcome: Progressing   

## 2022-06-23 NOTE — Progress Notes (Addendum)
PHARMACY - PHYSICIAN COMMUNICATION CRITICAL VALUE ALERT - BLOOD CULTURE IDENTIFICATION (BCID)  Patricia Burgess is an 56 y.o. female who presented to [facility] on 06/22/2022 with a chief complaint of abdominal pain and found to have acute diverticulitis.  Assessment: 1/4 BCx bottles growing E coli, likely abdominal source (no resistance genes detected)  Name of physician (or Provider) ContactedKarleen Hampshire  Current antibiotics: Rocephin 2g IV q24 hr; Flagyl  Changes to prescribed antibiotics recommended: No changes - Rocephin dosing is appropriate for E coli bacteremia; reasonable to continue Flagyl given suspected abdominal source. Recommendations accepted by provider  Results for orders placed or performed during the hospital encounter of 06/22/22  Blood Culture ID Panel (Reflexed) (Collected: 06/22/2022  7:40 AM)  Result Value Ref Range   Enterococcus faecalis NOT DETECTED NOT DETECTED   Enterococcus Faecium NOT DETECTED NOT DETECTED   Listeria monocytogenes NOT DETECTED NOT DETECTED   Staphylococcus species NOT DETECTED NOT DETECTED   Staphylococcus aureus (BCID) NOT DETECTED NOT DETECTED   Staphylococcus epidermidis NOT DETECTED NOT DETECTED   Staphylococcus lugdunensis NOT DETECTED NOT DETECTED   Streptococcus species NOT DETECTED NOT DETECTED   Streptococcus agalactiae NOT DETECTED NOT DETECTED   Streptococcus pneumoniae NOT DETECTED NOT DETECTED   Streptococcus pyogenes NOT DETECTED NOT DETECTED   A.calcoaceticus-baumannii NOT DETECTED NOT DETECTED   Bacteroides fragilis NOT DETECTED NOT DETECTED   Enterobacterales DETECTED (A) NOT DETECTED   Enterobacter cloacae complex NOT DETECTED NOT DETECTED   Escherichia coli DETECTED (A) NOT DETECTED   Klebsiella aerogenes NOT DETECTED NOT DETECTED   Klebsiella oxytoca NOT DETECTED NOT DETECTED   Klebsiella pneumoniae NOT DETECTED NOT DETECTED   Proteus species NOT DETECTED NOT DETECTED   Salmonella species NOT DETECTED NOT DETECTED    Serratia marcescens NOT DETECTED NOT DETECTED   Haemophilus influenzae NOT DETECTED NOT DETECTED   Neisseria meningitidis NOT DETECTED NOT DETECTED   Pseudomonas aeruginosa NOT DETECTED NOT DETECTED   Stenotrophomonas maltophilia NOT DETECTED NOT DETECTED   Candida albicans NOT DETECTED NOT DETECTED   Candida auris NOT DETECTED NOT DETECTED   Candida glabrata NOT DETECTED NOT DETECTED   Candida krusei NOT DETECTED NOT DETECTED   Candida parapsilosis NOT DETECTED NOT DETECTED   Candida tropicalis NOT DETECTED NOT DETECTED   Cryptococcus neoformans/gattii NOT DETECTED NOT DETECTED   CTX-M ESBL NOT DETECTED NOT DETECTED   Carbapenem resistance IMP NOT DETECTED NOT DETECTED   Carbapenem resistance KPC NOT DETECTED NOT DETECTED   Carbapenem resistance NDM NOT DETECTED NOT DETECTED   Carbapenem resist OXA 48 LIKE NOT DETECTED NOT DETECTED   Carbapenem resistance VIM NOT DETECTED NOT DETECTED    Ilah Boule A 06/23/2022  8:57 AM

## 2022-06-23 NOTE — Progress Notes (Signed)
[facility]                                                                               Patricia Burgess, is a 56 y.o. female, DOB - 13-Jun-1966, MPN:[id number] Admit date - 06/22/2022    Outpatient Primary MD for the patient is Erby Pian, PA-C  LOS - 1  days    Brief summary     Patricia Burgess is a 56 y.o. female with medical history significant of OSA not on CPAP, morbid obesity, HLD, pre-diabetes, uterine fibroids who presented from outside ED for abdominal pain and found to have acute diverticulitis  Assessment & Plan    Assessment and Plan: * Acute diverticulitis Uncomplicated without any abscess or perforation.  She was started on IV Rocephin and Flagyl, Recommend to continue the same. She was started on liquid diet , continue the same. Pain control.  -recommend pt follow up with routine colonoscopy outpatient  E coli bacteremia:  Continue with IV rocephin. Await sensitivities.   OSA (obstructive sleep apnea) Not adherent with CPAP  Pre-diabetes Last HbA1C of 5.9 on 03/2022  BMI 50.0-59.9, adult ([place]) Recommend weight loss, recommend outpatient follow up with [facility] for weight loss and healthy life style.      Estimated body mass index is 53.25 kg/m as calculated from the following:   Height as of this encounter: '5\' 5"'$  (1.651 m).   Weight as of this encounter: 145.2 kg.  Code Status: full code.  DVT Prophylaxis:  lovenox.    Level of Care: Level of care: Med-Surg Family Communication: none at bedside.   Disposition Plan:     Remains inpatient appropriate:    Procedures:  Non.   Consultants:   none.   Antimicrobials:   Anti-infectives (From admission, onward)    Start     Dose/Rate Route Frequency Ordered Stop   06/23/22 0500  metroNIDAZOLE (FLAGYL) IVPB 500 mg        500 mg 100 mL/hr over 60 Minutes Intravenous Every 12 hours 06/22/22 1835     06/22/22 2000  cefTRIAXone (ROCEPHIN) 2 g in sodium chloride 0.9 % 100 mL IVPB         2 g 200 mL/hr over 30 Minutes Intravenous Every 24 hours 06/22/22 1951     06/22/22 0930  ciprofloxacin (CIPRO) IVPB 400 mg        400 mg 200 mL/hr over 60 Minutes Intravenous  Once 06/22/22 0920 06/22/22 1142   06/22/22 0930  metroNIDAZOLE (FLAGYL) IVPB 500 mg  Status:  Discontinued        500 mg 100 mL/hr over 60 Minutes Intravenous Every 8 hours 06/22/22 0920 06/22/22 1834        Medications  Scheduled Meds:  enoxaparin (LOVENOX) injection  70 mg Subcutaneous Q24H   Continuous Infusions:  cefTRIAXone (ROCEPHIN)  IV Stopped (06/22/22 2134)   metronidazole 500 mg (06/23/22 0511)   PRN Meds:.acetaminophen, morphine injection, ondansetron (ZOFRAN) IV    Subjective:   Patricia Burgess was seen and examined today.  PAIN AND NAUSEA are better.   Objective:   Vitals:   06/22/22 2156 06/23/22 0151 06/23/22 0542 06/23/22 1038  BP: (!) 141/71  125/65 122/73 123/76  Pulse: 90 100 (!) 105 86  Resp: '17 17 17 18  '$ Temp: 98.8 F (37.1 C) 99.8 F (37.7 C) 98.4 F (36.9 C) 98.4 F (36.9 C)  TempSrc: Oral   Oral  SpO2: 99% 92% 95% 95%  Weight:      Height:        Intake/Output Summary (Last 24 hours) at 06/23/2022 1239 Last data filed at 06/23/2022 0620 Gross per 24 hour  Intake 1056.41 ml  Output --  Net 1056.41 ml   Filed Weights   06/22/22 0727  Weight: (!) 145.2 kg     Exam General: Alert and oriented x 3, NAD Cardiovascular: S1 S2 auscultated, no murmurs, RRR Respiratory: Clear to auscultation bilaterally, no wheezing, rales or rhonchi Gastrointestinal: Soft, nontender, nondistended, + bowel sounds Ext: no pedal edema bilaterally Neuro: AAOx3, Cr N's II- XII. Strength 5/5 upper and lower extremities bilaterally Skin: No rashes Psych: Normal affect and demeanor, alert and oriented x3    Data Reviewed:  I have personally reviewed following labs and imaging studies   CBC Lab Results  Component Value Date   WBC 6.8 06/22/2022   RBC 4.67 06/22/2022   HGB  14.5 06/22/2022   HCT 43.4 06/22/2022   MCV 92.9 06/22/2022   MCH 31.0 06/22/2022   PLT 247 06/22/2022   MCHC 33.4 06/22/2022   RDW 12.8 06/22/2022   LYMPHSABS 0.5 (L) 06/22/2022   MONOABS 0.1 06/22/2022   EOSABS 0.1 06/22/2022   BASOSABS 0.0 40/98/1191     Last metabolic panel Lab Results  Component Value Date   NA 137 06/23/2022   K 4.3 06/23/2022   CL 103 06/23/2022   CO2 26 06/23/2022   BUN 10 06/23/2022   CREATININE 0.97 06/23/2022   GLUCOSE 99 06/23/2022   GFRNONAA >60 06/23/2022   GFRAA >60 04/10/2017   CALCIUM 8.8 (L) 06/23/2022   PROT 8.1 06/22/2022   ALBUMIN 3.9 06/22/2022   BILITOT 1.1 06/22/2022   ALKPHOS 55 06/22/2022   AST 29 06/22/2022   ALT 23 06/22/2022   ANIONGAP 8 06/23/2022    CBG (last 3)  Recent Labs    06/22/22 0749  GLUCAP 121*      Coagulation Profile: Recent Labs  Lab 06/22/22 0745  INR 1.1     Radiology Studies: CT ABDOMEN PELVIS W CONTRAST  Result Date: 06/22/2022 CLINICAL DATA:  Left lower quadrant abdominal pain with nausea vomiting. EXAM: CT ABDOMEN AND PELVIS WITH CONTRAST TECHNIQUE: Multidetector CT imaging of the abdomen and pelvis was performed using the standard protocol following bolus administration of intravenous contrast. RADIATION DOSE REDUCTION: This exam was performed according to the departmental dose-optimization program which includes automated exposure control, adjustment of the mA and/or kV according to patient size and/or use of iterative reconstruction technique. CONTRAST:  151m OMNIPAQUE IOHEXOL 300 MG/ML  SOLN COMPARISON:  None Available. FINDINGS: Lower chest: Unremarkable. Hepatobiliary: No suspicious focal abnormality within the liver parenchyma. Gallbladder surgically absent. No intrahepatic or extrahepatic biliary dilation. Pancreas: No focal mass lesion. No dilatation of the main duct. No intraparenchymal cyst. No peripancreatic edema. Spleen: No splenomegaly. No focal mass lesion. Adrenals/Urinary  Tract: No adrenal nodule or mass. Kidneys unremarkable. No evidence for hydroureter. The urinary bladder appears normal for the degree of distention. Stomach is unremarkable. No gastric wall thickening. No evidence of outlet obstruction. Stomach/Bowel: Stomach is unremarkable. No gastric wall thickening. No evidence of outlet obstruction. Duodenum is normally positioned as is the ligament of Treitz. No small  bowel wall thickening. No small bowel dilatation. The terminal ileum is normal. The appendix is normal. No gross colonic mass. No colonic wall thickening. Diffuse diverticular disease evident. Ill-defined diverticulum in the distal descending colon has surrounding edema/inflammation (image 65/2 and coronal 76/5) consistent with diverticulitis. No evidence of perforation or abscess. Vascular/Lymphatic: No abdominal aortic aneurysm. There is no gastrohepatic or hepatoduodenal ligament lymphadenopathy. No retroperitoneal or mesenteric lymphadenopathy. No pelvic sidewall lymphadenopathy. Reproductive: Multiple calcified uterine fibroids evident. There is no adnexal mass. Other: No intraperitoneal free fluid. Musculoskeletal: No worrisome lytic or sclerotic osseous abnormality. IMPRESSION: 1. Diffuse diverticular disease with acute diverticulitis in the distal descending colon. No evidence of perforation or abscess. 2. Fibroid uterus. 3. Status post cholecystectomy. Electronically Signed   By: Misty Stanley M.D.   On: 06/22/2022 09:03   DG Chest Port 1 View  Result Date: 06/22/2022 CLINICAL DATA:  Possible sepsis, abdominal pain EXAM: PORTABLE CHEST 1 VIEW COMPARISON:  05/23/2021 FINDINGS: Transverse diameter of heart is increased. Central pulmonary vessels are prominent. There are no signs of pulmonary edema. There are no new focal infiltrates. There is no pleural effusion or pneumothorax. IMPRESSION: Cardiomegaly. There are no signs of pulmonary edema or focal pulmonary consolidation. Electronically Signed    By: Elmer Picker M.D.   On: 06/22/2022 08:34       Hosie Poisson M.D. [facility] 06/23/2022, 12:39 PM  Available via Epic secure chat 7am-7pm After 7 pm, please refer to night coverage provider listed on amion.

## 2022-06-24 LAB — CBC WITH DIFFERENTIAL/PLATELET
Abs Immature Granulocytes: 0 10*3/uL (ref 0.00–0.07)
Basophils Absolute: 0 10*3/uL (ref 0.0–0.1)
Basophils Relative: 1 %
Eosinophils Absolute: 0.2 10*3/uL (ref 0.0–0.5)
Eosinophils Relative: 5 %
HCT: 41 % (ref 36.0–46.0)
Hemoglobin: 13.1 g/dL (ref 12.0–15.0)
Immature Granulocytes: 0 %
Lymphocytes Relative: 10 %
Lymphs Abs: 0.5 10*3/uL — ABNORMAL LOW (ref 0.7–4.0)
MCH: 31 pg (ref 26.0–34.0)
MCHC: 32 g/dL (ref 30.0–36.0)
MCV: 97.2 fL (ref 80.0–100.0)
Monocytes Absolute: 0.8 10*3/uL (ref 0.1–1.0)
Monocytes Relative: 18 %
Neutro Abs: 2.9 10*3/uL (ref 1.7–7.7)
Neutrophils Relative %: 66 %
Platelets: 195 10*3/uL (ref 150–400)
RBC: 4.22 MIL/uL (ref 3.87–5.11)
RDW: 12.7 % (ref 11.5–15.5)
WBC: 4.3 10*3/uL (ref 4.0–10.5)
nRBC: 0 % (ref 0.0–0.2)

## 2022-06-24 LAB — CULTURE, BLOOD (ROUTINE X 2): Special Requests: ADEQUATE

## 2022-06-24 LAB — BASIC METABOLIC PANEL
Anion gap: 8 (ref 5–15)
BUN: 8 mg/dL (ref 6–20)
CO2: 25 mmol/L (ref 22–32)
Calcium: 8.7 mg/dL — ABNORMAL LOW (ref 8.9–10.3)
Chloride: 102 mmol/L (ref 98–111)
Creatinine, Ser: 0.85 mg/dL (ref 0.44–1.00)
GFR, Estimated: >60 mL/min (ref >60)
Glucose, Bld: 102 mg/dL — ABNORMAL HIGH (ref 70–99)
Potassium: 3.9 mmol/L (ref 3.5–5.1)
Sodium: 135 mmol/L (ref 135–145)

## 2022-06-24 NOTE — Plan of Care (Signed)
  Problem: Education: Goal: Knowledge of General Education information will improve Description: Including pain rating scale, medication(s)/side effects and non-pharmacologic comfort measures Outcome: Progressing   Problem: Coping: Goal: Level of anxiety will decrease Outcome: Progressing   Problem: Pain Managment: Goal: General experience of comfort will improve Outcome: Progressing   

## 2022-06-24 NOTE — Progress Notes (Signed)
[facility]                                                                               Patricia Burgess, is a 56 y.o. female, DOB - 1965-12-22, LFY:[id number] Admit date - 06/22/2022    Outpatient Primary MD for the patient is Patricia Pian, PA-C  LOS - 2  days    Brief summary     Patricia Burgess is a 56 y.o. female with medical history significant of OSA not on CPAP, morbid obesity, HLD, pre-diabetes, uterine fibroids who presented from outside ED for abdominal pain and found to have acute diverticulitis  Assessment & Plan    Assessment and Plan: * Acute diverticulitis Uncomplicated without any abscess or perforation.  She was started on IV Rocephin and Flagyl, Recommend to continue the same. She was started on liquid diet , advance to soft today.  She denies any nausea, vomiting, loose BM this am, abd pain improving.  Pain control.  -recommend pt follow up with routine colonoscopy outpatient  E coli bacteremia:  Continue with IV rocephin. Await sensitivities.   OSA (obstructive sleep apnea) Not adherent with CPAP  Pre-diabetes Last HbA1C of 5.9 on 03/2022  BMI 50.0-59.9, adult ([place]) Recommend weight loss, recommend outpatient follow up with [facility] for weight loss and healthy life style.      Estimated body mass index is 53.25 kg/m as calculated from the following:   Height as of this encounter: '5\' 5"'$  (1.651 m).   Weight as of this encounter: 145.2 kg.  Code Status: full code.  DVT Prophylaxis:  pt refusing lovenox and is ambulatory.    Level of Care: Level of care: Med-Surg Family Communication: none at bedside.   Disposition Plan:     Remains inpatient appropriate:    Procedures:  Non.   Consultants:   none.   Antimicrobials:   Anti-infectives (From admission, onward)    Start     Dose/Rate Route Frequency Ordered Stop   06/23/22 0500  metroNIDAZOLE (FLAGYL) IVPB 500 mg        500 mg 100 mL/hr over 60 Minutes Intravenous Every  12 hours 06/22/22 1835     06/22/22 2000  cefTRIAXone (ROCEPHIN) 2 g in sodium chloride 0.9 % 100 mL IVPB        2 g 200 mL/hr over 30 Minutes Intravenous Every 24 hours 06/22/22 1951     06/22/22 0930  ciprofloxacin (CIPRO) IVPB 400 mg        400 mg 200 mL/hr over 60 Minutes Intravenous  Once 06/22/22 0920 06/22/22 1142   06/22/22 0930  metroNIDAZOLE (FLAGYL) IVPB 500 mg  Status:  Discontinued        500 mg 100 mL/hr over 60 Minutes Intravenous Every 8 hours 06/22/22 0920 06/22/22 1834        Medications  Scheduled Meds:  enoxaparin (LOVENOX) injection  70 mg Subcutaneous Q24H   Continuous Infusions:  sodium chloride Stopped (06/24/22 1200)   cefTRIAXone (ROCEPHIN)  IV 2 g (06/23/22 2051)   metronidazole 500 mg (06/24/22 0657)   PRN Meds:.acetaminophen, morphine injection, ondansetron (ZOFRAN) IV    Subjective:   Patricia Burgess was seen and  examined today.  Abd pain, nausea, are better. No vomiting. One loose BM this am.   Objective:   Vitals:   06/23/22 1334 06/23/22 2030 06/24/22 0427 06/24/22 1319  BP: 129/80 (!) 142/74 113/71 (!) 147/77  Pulse: 82 82 74 80  Resp: '18 18 18 16  '$ Temp: 98.2 F (36.8 C) 99.1 F (37.3 C) 98.3 F (36.8 C) 98.4 F (36.9 C)  TempSrc: Oral Oral Oral Oral  SpO2: 96% 98% 96% 98%  Weight:      Height:        Intake/Output Summary (Last 24 hours) at 06/24/2022 1409 Last data filed at 06/24/2022 0940 Gross per 24 hour  Intake 2590.12 ml  Output --  Net 2590.12 ml    Filed Weights   06/22/22 0727  Weight: (!) 145.2 kg     Exam General exam: Appears calm and comfortable  Respiratory system: Clear to auscultation. Respiratory effort normal. Cardiovascular system: S1 & S2 heard, RRR. No JVD, murmurs, rubs, gallops or clicks. No pedal edema. Gastrointestinal system: Abdomen is soft,mild tenderness int he LLQ. Bs+ Central nervous system: Alert and oriented. No focal neurological deficits. Extremities: Symmetric 5 x 5 power. Skin:  No rashes, lesions or ulcers Psychiatry: Judgement and insight appear normal. Mood & affect appropriate.     Data Reviewed:  I have personally reviewed following labs and imaging studies   CBC Lab Results  Component Value Date   WBC 4.3 06/24/2022   RBC 4.22 06/24/2022   HGB 13.1 06/24/2022   HCT 41.0 06/24/2022   MCV 97.2 06/24/2022   MCH 31.0 06/24/2022   PLT 195 06/24/2022   MCHC 32.0 06/24/2022   RDW 12.7 06/24/2022   LYMPHSABS 0.5 (L) 06/24/2022   MONOABS 0.8 06/24/2022   EOSABS 0.2 06/24/2022   BASOSABS 0.0 88/41/6606     Last metabolic panel Lab Results  Component Value Date   NA 135 06/24/2022   K 3.9 06/24/2022   CL 102 06/24/2022   CO2 25 06/24/2022   BUN 8 06/24/2022   CREATININE 0.85 06/24/2022   GLUCOSE 102 (H) 06/24/2022   GFRNONAA >60 06/24/2022   GFRAA >60 04/10/2017   CALCIUM 8.7 (L) 06/24/2022   PROT 8.1 06/22/2022   ALBUMIN 3.9 06/22/2022   BILITOT 1.1 06/22/2022   ALKPHOS 55 06/22/2022   AST 29 06/22/2022   ALT 23 06/22/2022   ANIONGAP 8 06/24/2022    CBG (last 3)  Recent Labs    06/22/22 0749  GLUCAP 121*       Coagulation Profile: Recent Labs  Lab 06/22/22 0745  INR 1.1      Radiology Studies: No results found.     Hosie Poisson M.D. [facility] 06/24/2022, 2:09 PM  Available via Epic secure chat 7am-7pm After 7 pm, please refer to night coverage provider listed on amion.

## 2022-06-25 LAB — CULTURE, BLOOD (ROUTINE X 2)
Culture: NO GROWTH
Special Requests: ADEQUATE

## 2022-06-25 MED ORDER — ONDANSETRON HCL 4 MG PO TABS: 4.0000 mg | ORAL_TABLET | Freq: Three times a day (TID) | ORAL | 0 refills | Status: DC | PRN

## 2022-06-25 MED ORDER — CIPROFLOXACIN HCL 500 MG PO TABS: 500.0000 mg | ORAL_TABLET | Freq: Two times a day (BID) | ORAL | 0 refills | Status: AC

## 2022-06-25 NOTE — Discharge Summary (Signed)
Physician Discharge Summary   Patient: Patricia Burgess MRN: [id number] DOB: [birth year]  Admit date:     06/22/2022  Discharge date: 06/25/2022  Discharge Physician: Hosie Poisson   PCP: Erby Pian, PA-C   Recommendations at discharge:  Please follow up with PCp in one week.  Please follow up with GI iin 4 to 6 weeks for colonoscopy.   Discharge Diagnoses: Principal Problem:   Acute diverticulitis Active Problems:   BMI 50.0-59.9, adult (HCC)   Pre-diabetes   OSA (obstructive sleep apnea)   Hospital Course:     Patricia Burgess is a 56 y.o. female with medical history significant of OSA not on CPAP, morbid obesity, HLD, pre-diabetes, uterine fibroids who presented from outside ED for abdominal pain and found to have acute diverticulitis Assessment and Plan: Acute diverticulitis Uncomplicated without any abscess or perforation.  She was started on IV Rocephin and Flagyl, Recommend to continue the same. She was started on liquid diet , advance to soft  and able to tolerate. She was discharged on 11 days of ciprofloxacin to complete the course.  She denies any nausea, vomiting, loose BM this am, abd pain improving.  Pain control.  -recommend pt follow up with routine colonoscopy outpatient   E coli bacteremia:  Continue with IV rocephin. Sensitives reviewed. Discharged on ciprofloxacin.    OSA (obstructive sleep apnea) Not adherent with CPAP   Pre-diabetes Last HbA1C of 5.9 on 03/2022   BMI 50.0-59.9, adult ([place]) Recommend weight loss, recommend outpatient follow up with PCp for weight loss and healthy life style.      Estimated body mass index is 53.25 kg/m as calculated from the following:   Height as of this encounter: '5\' 5"'$  (1.651 m).   Weight as of this encounter: 145.2 kg.    Consultants: none.  Procedures performed: none   Disposition: Home Diet recommendation:  Discharge Diet Orders (From admission, onward)     Start     Ordered   06/25/22 0000  Diet  - low sodium heart healthy        06/25/22 1115           Regular diet DISCHARGE MEDICATION: Allergies as of 06/25/2022       Reactions   Penicillins Other (See Comments)   Yeast infection         Medication List     TAKE these medications    ciprofloxacin 500 MG tablet Commonly known as: Cipro Take 1 tablet (500 mg total) by mouth 2 (two) times daily for 11 days.   methocarbamol 500 MG tablet Commonly known as: ROBAXIN Take 1 tablet (500 mg total) by mouth 2 (two) times daily.   Mounjaro 2.5 MG/0.5ML Pen Generic drug: tirzepatide Inject 2.'5mg'$  under the skin once a week for 4 weeks   Mounjaro 2.5 MG/0.5ML Pen Generic drug: tirzepatide Inject 2.'5mg'$  under the skin once weekly for 4 weeks   ondansetron 4 MG tablet Commonly known as: Zofran Take 1 tablet (4 mg total) by mouth every 8 (eight) hours as needed for nausea or vomiting.   orphenadrine 100 MG tablet Commonly known as: NORFLEX Take 1 tablet (100 mg total) by mouth 2 (two) times daily as needed for muscle spasms (pain).        Discharge Exam: Filed Weights   06/22/22 0727  Weight: (!) 145.2 kg   General exam: Appears calm and comfortable  Respiratory system: Clear to auscultation. Respiratory effort normal. Cardiovascular system: S1 & S2 heard, RRR. No JVD, murmurs,  rubs, gallops or clicks. No pedal edema. Gastrointestinal system: Abdomen is nondistended, soft and nontender. No organomegaly or masses felt. Normal bowel sounds heard. Central nervous system: Alert and oriented. No focal neurological deficits. Extremities: Symmetric 5 x 5 power. Skin: No rashes, lesions or ulcers Psychiatry: Judgement and insight appear normal. Mood & affect appropriate.    Condition at discharge: fair  The results of significant diagnostics from this hospitalization (including imaging, microbiology, ancillary and laboratory) are listed below for reference.   Imaging Studies: CT ABDOMEN PELVIS W  CONTRAST  Result Date: 06/22/2022 CLINICAL DATA:  Left lower quadrant abdominal pain with nausea vomiting. EXAM: CT ABDOMEN AND PELVIS WITH CONTRAST TECHNIQUE: Multidetector CT imaging of the abdomen and pelvis was performed using the standard protocol following bolus administration of intravenous contrast. RADIATION DOSE REDUCTION: This exam was performed according to the departmental dose-optimization program which includes automated exposure control, adjustment of the mA and/or kV according to patient size and/or use of iterative reconstruction technique. CONTRAST:  158m OMNIPAQUE IOHEXOL 300 MG/ML  SOLN COMPARISON:  None Available. FINDINGS: Lower chest: Unremarkable. Hepatobiliary: No suspicious focal abnormality within the liver parenchyma. Gallbladder surgically absent. No intrahepatic or extrahepatic biliary dilation. Pancreas: No focal mass lesion. No dilatation of the main duct. No intraparenchymal cyst. No peripancreatic edema. Spleen: No splenomegaly. No focal mass lesion. Adrenals/Urinary Tract: No adrenal nodule or mass. Kidneys unremarkable. No evidence for hydroureter. The urinary bladder appears normal for the degree of distention. Stomach is unremarkable. No gastric wall thickening. No evidence of outlet obstruction. Stomach/Bowel: Stomach is unremarkable. No gastric wall thickening. No evidence of outlet obstruction. Duodenum is normally positioned as is the ligament of Treitz. No small bowel wall thickening. No small bowel dilatation. The terminal ileum is normal. The appendix is normal. No gross colonic mass. No colonic wall thickening. Diffuse diverticular disease evident. Ill-defined diverticulum in the distal descending colon has surrounding edema/inflammation (image 65/2 and coronal 76/5) consistent with diverticulitis. No evidence of perforation or abscess. Vascular/Lymphatic: No abdominal aortic aneurysm. There is no gastrohepatic or hepatoduodenal ligament lymphadenopathy. No  retroperitoneal or mesenteric lymphadenopathy. No pelvic sidewall lymphadenopathy. Reproductive: Multiple calcified uterine fibroids evident. There is no adnexal mass. Other: No intraperitoneal free fluid. Musculoskeletal: No worrisome lytic or sclerotic osseous abnormality. IMPRESSION: 1. Diffuse diverticular disease with acute diverticulitis in the distal descending colon. No evidence of perforation or abscess. 2. Fibroid uterus. 3. Status post cholecystectomy. Electronically Signed   By: EMisty StanleyM.D.   On: 06/22/2022 09:03   DG Chest Port 1 View  Result Date: 06/22/2022 CLINICAL DATA:  Possible sepsis, abdominal pain EXAM: PORTABLE CHEST 1 VIEW COMPARISON:  05/23/2021 FINDINGS: Transverse diameter of heart is increased. Central pulmonary vessels are prominent. There are no signs of pulmonary edema. There are no new focal infiltrates. There is no pleural effusion or pneumothorax. IMPRESSION: Cardiomegaly. There are no signs of pulmonary edema or focal pulmonary consolidation. Electronically Signed   By: PElmer PickerM.D.   On: 06/22/2022 08:34    Microbiology: Results for orders placed or performed during the hospital encounter of 06/22/22  Culture, blood (Routine x 2)     Status: Abnormal   Collection Time: 06/22/22  7:40 AM   Specimen: Left Antecubital; Blood  Result Value Ref Range Status   Specimen Description   Final    LEFT ANTECUBITAL Performed at [facility] [address] [place] [id number]   Special Requests   Final    BOTTLES DRAWN  AEROBIC AND ANAEROBIC Blood Culture adequate volume Performed at [facility], [address] [place], [place] [address]    Culture  Setup Time   Final    AEROBIC BOTTLE ONLY GRAM NEGATIVE RODS CRITICAL RESULT CALLED TO, READ BACK BY AND VERIFIED WITH: PHARMD ERIN WILLIAMSON 06/23/22 '@0843'$  BY AB Performed at [facility], [place] [address] [place], [place] [address]    Culture ESCHERICHIA COLI (A)   Final   Report Status 06/25/2022 FINAL  Final   Organism ID, Bacteria ESCHERICHIA COLI  Final      Susceptibility   Escherichia coli - MIC*    AMPICILLIN >=32 RESISTANT Resistant     CEFAZOLIN <=4 SENSITIVE Sensitive     CEFEPIME <=0.12 SENSITIVE Sensitive     CEFTAZIDIME <=1 SENSITIVE Sensitive     CEFTRIAXONE <=0.25 SENSITIVE Sensitive     CIPROFLOXACIN <=0.25 SENSITIVE Sensitive     GENTAMICIN <=1 SENSITIVE Sensitive     IMIPENEM <=0.25 SENSITIVE Sensitive     TRIMETH/SULFA <=20 SENSITIVE Sensitive     AMPICILLIN/SULBACTAM 16 INTERMEDIATE Intermediate     PIP/TAZO <=4 SENSITIVE Sensitive     * ESCHERICHIA COLI  Blood Culture ID Panel (Reflexed)     Status: Abnormal   Collection Time: 06/22/22  7:40 AM  Result Value Ref Range Status   Enterococcus faecalis NOT DETECTED NOT DETECTED Final   Enterococcus Faecium NOT DETECTED NOT DETECTED Final   Listeria monocytogenes NOT DETECTED NOT DETECTED Final   Staphylococcus species NOT DETECTED NOT DETECTED Final   Staphylococcus aureus (BCID) NOT DETECTED NOT DETECTED Final   Staphylococcus epidermidis NOT DETECTED NOT DETECTED Final   Staphylococcus lugdunensis NOT DETECTED NOT DETECTED Final   Streptococcus species NOT DETECTED NOT DETECTED Final   Streptococcus agalactiae NOT DETECTED NOT DETECTED Final   Streptococcus pneumoniae NOT DETECTED NOT DETECTED Final   Streptococcus pyogenes NOT DETECTED NOT DETECTED Final   A.calcoaceticus-baumannii NOT DETECTED NOT DETECTED Final   Bacteroides fragilis NOT DETECTED NOT DETECTED Final   Enterobacterales DETECTED (A) NOT DETECTED Final    Comment: Enterobacterales represent a large order of gram negative bacteria, not a single organism. CRITICAL RESULT CALLED TO, READ BACK BY AND VERIFIED WITH:  PHARMD ERIN WILLIAMSON 06/23/22 @ 0843 BY AB    Enterobacter cloacae complex NOT DETECTED NOT DETECTED Final   Escherichia coli DETECTED (A) NOT DETECTED Final    Comment: CRITICAL RESULT CALLED  TO, READ BACK BY AND VERIFIED WITH: PHARMD ERIN WILLIAMSON 06/23/22 '@0843'$  BY AB    Klebsiella aerogenes NOT DETECTED NOT DETECTED Final   Klebsiella oxytoca NOT DETECTED NOT DETECTED Final   Klebsiella pneumoniae NOT DETECTED NOT DETECTED Final   Proteus species NOT DETECTED NOT DETECTED Final   Salmonella species NOT DETECTED NOT DETECTED Final   Serratia marcescens NOT DETECTED NOT DETECTED Final   Haemophilus influenzae NOT DETECTED NOT DETECTED Final   Neisseria meningitidis NOT DETECTED NOT DETECTED Final   Pseudomonas aeruginosa NOT DETECTED NOT DETECTED Final   Stenotrophomonas maltophilia NOT DETECTED NOT DETECTED Final   Candida albicans NOT DETECTED NOT DETECTED Final   Candida auris NOT DETECTED NOT DETECTED Final   Candida glabrata NOT DETECTED NOT DETECTED Final   Candida krusei NOT DETECTED NOT DETECTED Final   Candida parapsilosis NOT DETECTED NOT DETECTED Final   Candida tropicalis NOT DETECTED NOT DETECTED Final   Cryptococcus neoformans/gattii NOT DETECTED NOT DETECTED Final   CTX-M ESBL NOT DETECTED NOT DETECTED Final   Carbapenem resistance IMP  NOT DETECTED NOT DETECTED Final   Carbapenem resistance KPC NOT DETECTED NOT DETECTED Final   Carbapenem resistance NDM NOT DETECTED NOT DETECTED Final   Carbapenem resist OXA 48 LIKE NOT DETECTED NOT DETECTED Final   Carbapenem resistance VIM NOT DETECTED NOT DETECTED Final    Comment: Performed at [facility], [place] [address] [place], [place] [address]  SARS Coronavirus 2 by RT PCR (hospital order, performed in [facility] hospital lab) *cepheid single result Burgess* Urine, Clean Catch     Status: None   Collection Time: 06/22/22  7:44 AM   Specimen: Urine, Clean Catch; Nasal Swab  Result Value Ref Range Status   SARS Coronavirus 2 by RT PCR NEGATIVE NEGATIVE Final    Comment: (NOTE) SARS-CoV-2 target nucleic acids are NOT DETECTED.  The SARS-CoV-2 RNA is generally detectable in upper and lower respiratory  specimens during the acute phase of infection. The lowest concentration of SARS-CoV-2 viral copies this assay can detect is 250 copies / mL. A negative result does not preclude SARS-CoV-2 infection and should not be used as the sole basis for treatment or other patient management decisions.  A negative result may occur with improper specimen collection / handling, submission of specimen other than nasopharyngeal swab, presence of viral mutation(s) within the areas targeted by this assay, and inadequate number of viral copies (<250 copies / mL). A negative result must be combined with clinical observations, patient history, and epidemiological information.  Fact Sheet for Patients:   https://www.patel.info/  Fact Sheet for Healthcare Providers: https://hall.com/  This Burgess is not yet approved or  cleared by the [place] FDA and has been authorized for detection and/or diagnosis of SARS-CoV-2 by FDA under an Emergency Use Authorization (EUA).  This EUA will remain in effect (meaning this Burgess can be used) for the duration of the COVID-19 declaration under Section 564(b)(1) of the Act, 21 U.S.C. section 360bbb-3(b)(1), unless the authorization is terminated or revoked sooner.  Performed at [facility], [address] [place], [place] [address]   Culture, blood (Routine x 2)     Status: None (Preliminary result)   Collection Time: 06/22/22  7:50 AM   Specimen: BLOOD LEFT HAND  Result Value Ref Range Status   Specimen Description   Final    BLOOD LEFT HAND Performed at [facility], [address] [place], [place] [address]    Special Requests   Final    BOTTLES DRAWN AEROBIC AND ANAEROBIC Blood Culture adequate volume Performed at [facility], [address] [place], [place] [address]    Culture   Final    NO GROWTH 3 DAYS Performed at [facility], [place] [address], [place], [place]  [address]    Report Status PENDING  Incomplete    Labs: CBC: Recent Labs  Lab 06/22/22 0745 06/24/22 0736  WBC 6.8 4.3  NEUTROABS 6.2 2.9  HGB 14.5 13.1  HCT 43.4 41.0  MCV 92.9 97.2  PLT 247 756   Basic Metabolic Panel: Recent Labs  Lab 06/22/22 0745 06/23/22 0338 06/24/22 0736  NA 136 137 135  K 4.0 4.3 3.9  CL 103 103 102  CO2 '23 26 25  '$ GLUCOSE 127* 99 102*  BUN '12 10 8  '$ CREATININE 1.00 0.97 0.85  CALCIUM 9.2 8.8* 8.7*   Liver Function Tests: Recent Labs  Lab 06/22/22 0745  AST 29  ALT 23  ALKPHOS 55  BILITOT 1.1  PROT 8.1  ALBUMIN 3.9   CBG: Recent Labs  Lab 06/22/22 0749  GLUCAP 121*    Discharge time spent: 43 minutes.   Signed: Hosie Poisson, MD [facility] 06/25/2022

## 2022-06-25 NOTE — Progress Notes (Signed)
Pt ambulated in hall without complication. Pt also tolerated her soft breakfast well. No needs at this time.

## 2022-06-25 NOTE — Plan of Care (Signed)
  Problem: Activity: Goal: Risk for activity intolerance will decrease Outcome: Progressing   Problem: Nutrition: Goal: Adequate nutrition will be maintained Outcome: Progressing   Problem: Coping: Goal: Level of anxiety will decrease Outcome: Progressing   Problem: Elimination: Goal: Will not experience complications related to bowel motility Outcome: Progressing   

## 2022-06-25 NOTE — TOC Progression Note (Signed)
Transition of Care (TOC) - Progression Note    Transition of Care (TOC) Screening Note   Patient Details  Name: Shikira Folino Date of Birth: 02-Jan-1966   Transition of Care [facility]) CM/SW Contact:    Henrietta Dine, RN Phone Number: 06/25/2022, 12:07 PM      Patient Details  Name: Maiya Kates MRN: [id number] Date of Birth: 03/31/1966  Transition of Care [facility]) CM/SW Contact  Henrietta Dine, RN Phone Number: 06/25/2022, 12:06 PM  Clinical Narrative:    Transition of Care Department [facility]) has reviewed patient and no TOC needs have been identified at this time. We will continue to monitor patient advancement through interdisciplinary progression rounds. If new patient transition needs arise, please place a TOC consult.        Expected Discharge Plan and Services           Expected Discharge Date: 06/25/22                                     Social Determinants of Health (SDOH) Interventions    Readmission Risk Interventions     No data to display

## 2022-06-25 NOTE — Progress Notes (Signed)
Pt stable at time of d/c instructions and education. Family at bedside at time of d/c.

## 2022-06-25 NOTE — Plan of Care (Signed)
Pt stable at this time. Pt to d/c home with family when ride is available.

## 2023-11-23 ENCOUNTER — Encounter (HOSPITAL_BASED_OUTPATIENT_CLINIC_OR_DEPARTMENT_OTHER): Payer: Self-pay | Admitting: Emergency Medicine

## 2023-11-23 ENCOUNTER — Emergency Department (HOSPITAL_BASED_OUTPATIENT_CLINIC_OR_DEPARTMENT_OTHER)

## 2023-11-23 ENCOUNTER — Other Ambulatory Visit: Payer: Self-pay

## 2023-11-23 ENCOUNTER — Emergency Department (HOSPITAL_BASED_OUTPATIENT_CLINIC_OR_DEPARTMENT_OTHER)
Admission: EM | Admit: 2023-11-23 | Discharge: 2023-11-23 | Disposition: A | Discharge status: Home / Self Care | Attending: Emergency Medicine | Admitting: Emergency Medicine

## 2023-11-23 DIAGNOSIS — Z79899 Other long term (current) drug therapy: Secondary | ICD-10-CM | POA: Insufficient documentation

## 2023-11-23 DIAGNOSIS — R200 Anesthesia of skin: Secondary | ICD-10-CM | POA: Insufficient documentation

## 2023-11-23 DIAGNOSIS — J40 Bronchitis, not specified as acute or chronic: Secondary | ICD-10-CM | POA: Insufficient documentation

## 2023-11-23 DIAGNOSIS — R202 Paresthesia of skin: Secondary | ICD-10-CM

## 2023-11-23 DIAGNOSIS — J0100 Acute maxillary sinusitis, unspecified: Secondary | ICD-10-CM | POA: Insufficient documentation

## 2023-11-23 LAB — CBC
HCT: 42.4 % (ref 36.0–46.0)
Hemoglobin: 13.9 g/dL (ref 12.0–15.0)
MCH: 30.8 pg (ref 26.0–34.0)
MCHC: 32.8 g/dL (ref 30.0–36.0)
MCV: 94 fL (ref 80.0–100.0)
Platelets: 295 10*3/uL (ref 150–400)
RBC: 4.51 MIL/uL (ref 3.87–5.11)
RDW: 13.1 % (ref 11.5–15.5)
WBC: 4.5 10*3/uL (ref 4.0–10.5)
nRBC: 0 % (ref 0.0–0.2)

## 2023-11-23 LAB — BASIC METABOLIC PANEL
Anion gap: 9 (ref 5–15)
BUN: 9 mg/dL (ref 6–20)
CO2: 24 mmol/L (ref 22–32)
Calcium: 9 mg/dL (ref 8.9–10.3)
Chloride: 104 mmol/L (ref 98–111)
Creatinine, Ser: 0.88 mg/dL (ref 0.44–1.00)
GFR, Estimated: >60 mL/min (ref >60)
Glucose, Bld: 106 mg/dL — ABNORMAL HIGH (ref 70–99)
Potassium: 4.2 mmol/L (ref 3.5–5.1)
Sodium: 137 mmol/L (ref 135–145)

## 2023-11-23 MED ORDER — IPRATROPIUM-ALBUTEROL 0.5-2.5 (3) MG/3ML IN SOLN
3.0000 mL | Freq: Once | RESPIRATORY_TRACT | Status: AC
  Administered 2023-11-23: 3 mL via RESPIRATORY_TRACT
  Filled 2023-11-23: qty 3

## 2023-11-23 MED ORDER — METHYLPREDNISOLONE 4 MG PO TBPK: ORAL_TABLET | ORAL | 0 refills | Status: AC

## 2023-11-23 MED ORDER — ALBUTEROL SULFATE (5 MG/ML) 0.5% IN NEBU: 2.5000 mg | INHALATION_SOLUTION | Freq: Four times a day (QID) | RESPIRATORY_TRACT | 3 refills | Status: AC | PRN

## 2023-11-23 MED ORDER — FLUCONAZOLE 150 MG PO TABS: 150.0000 mg | ORAL_TABLET | Freq: Once | ORAL | 0 refills | Status: AC

## 2023-11-23 MED ORDER — AMOXICILLIN-POT CLAVULANATE 875-125 MG PO TABS: 1.0000 | ORAL_TABLET | Freq: Two times a day (BID) | ORAL | 0 refills | Status: AC

## 2023-11-23 NOTE — ED Triage Notes (Signed)
 Pt admits to bilateral facial numbness with left handed numbness that started 20 minutes ago.  BEFAST negative.  VAN negative   Pt states she doesn't have decreased sensation.  Pt is HTN in triage, with no history.  Pt has recently been sick.

## 2023-11-23 NOTE — Discharge Instructions (Signed)
 You were seen in the ER for facial numbness.  I think your symptoms could be coming from a sinus infection. All your blood work and imaging looked reassuring. I have sent prescriptions for an antibiotic, steroids, and breathing treatments to your pharmacy. You had a documented reaction of a yeast infection after penicillins, so I ordered an antifungal medication you can take if you develop symptoms of a yeast infection.  Continue to monitor how you're doing and return to the ER for new or worsening symptoms such as weakness or numbness of your extremities, difficulties talking or walking, dizziness, etc.

## 2023-11-23 NOTE — ED Provider Notes (Signed)
 [place] [facility] AT [facility] Provider Note   CSN: [id number] Arrival date & time: 11/23/23  1200     History  Chief Complaint  Patient presents with   Numbness    Patricia Burgess is a 58 y.o. female with hx of fibroids, sleep apnea, who presents to the ED with concern for facial numbness starting about 30 min prior to ER arrival. Initially was having tingling in the face on the upper cheeks along with "tightness" in the left hand/wrist. Hand symptoms have resolved. No headache, blurry vision, weakness, slurred speech, gait instability, dizziness. Reports having been sick last week with nasal congestion, cough. Still has both but overall feels much better. Has been using albuterol inhaler.   HPI     Home Medications Prior to Admission medications   Medication Sig Start Date End Date Taking? Authorizing Provider  albuterol (PROVENTIL) (5 MG/ML) 0.5% nebulizer solution Take 0.5 mLs (2.5 mg total) by nebulization every 6 (six) hours as needed for wheezing or shortness of breath. 11/23/23  Yes Roshni Burbano T, PA-C  albuterol (VENTOLIN HFA) 108 (90 Base) MCG/ACT inhaler Inhale 2 puffs into the lungs every 4 (four) hours as needed for wheezing or shortness of breath. 11/15/23  Yes [provider]  amoxicillin-clavulanate (AUGMENTIN) 875-125 MG tablet Take 1 tablet by mouth every 12 (twelve) hours. 11/23/23  Yes Joniece Smotherman T, PA-C  fluconazole (DIFLUCAN) 150 MG tablet Take 1 tablet (150 mg total) by mouth once for 1 dose. Take second tablet 72 hrs later if still experiencing symptoms of yeast infection. 11/23/23 11/23/23 Yes Cristella Stiver T, PA-C  methylPREDNISolone (MEDROL DOSEPAK) 4 MG TBPK tablet Take per package instructions 11/23/23  Yes Lesle Faron T, PA-C  predniSONE (STERAPRED UNI-PAK 21 TAB) 10 MG (21) TBPK tablet Take 10-60 mg by mouth as directed. Take as directed for 6 days. (6, 5, 4, 3, 2, 1) 11/15/23  Yes [provider]   oseltamivir (TAMIFLU) 75 MG capsule Take 75 mg by mouth 2 (two) times daily. Patient not taking: Reported on 11/23/2023 11/15/23   [provider]  tirzepatide Greggory Keen) 2.5 MG/0.5ML Pen Inject 2.5mg  under the skin once weekly for 4 weeks Patient not taking: Reported on 11/23/2023 12/16/21         Allergies    Penicillins    Review of Systems   Review of Systems  HENT:  Positive for congestion.   Respiratory:  Positive for cough and wheezing.   Neurological:  Positive for numbness.  All other systems reviewed and are negative.   Physical Exam Updated Vital Signs BP (!) 152/74   Pulse 72   Temp 98.1 F (36.7 C)   Resp (!) 22   Ht 5\' 5"  (1.651 m)   Wt (!) 147 kg   SpO2 98%   BMI 53.92 kg/m  Physical Exam Vitals and nursing note reviewed.  Constitutional:      Appearance: Normal appearance.  HENT:     Head: Normocephalic and atraumatic.     Nose:     Right Sinus: Maxillary sinus tenderness present.     Left Sinus: Maxillary sinus tenderness present.  Eyes:     Conjunctiva/sclera: Conjunctivae normal.  Cardiovascular:     Rate and Rhythm: Normal rate and regular rhythm.  Pulmonary:     Effort: Pulmonary effort is normal. No respiratory distress.     Breath sounds: Wheezing present.     Comments: Very mild wheezing Abdominal:     General: There is no  distension.     Palpations: Abdomen is soft.     Tenderness: There is no abdominal tenderness.  Skin:    General: Skin is warm and dry.  Neurological:     General: No focal deficit present.     Mental Status: She is alert.     Comments: "Tingling" to the bilateral upper cheeks, otherwise CN's grossly intact. PERRLA. EOMI. Sensation intact throughout. Str 5/5 all extremities.     ED Results / Procedures / Treatments   Labs (all labs ordered are listed, but only abnormal results are displayed) Labs Reviewed  BASIC METABOLIC PANEL - Abnormal; Notable for the following components:      Result Value   Glucose,  Bld 106 (*)    All other components within normal limits  CBC    EKG EKG Interpretation Date/Time:  Friday November 23 2023 12:35:39 EST Ventricular Rate:  79 PR Interval:  156 QRS Duration:  86 QT Interval:  352 QTC Calculation: 404 R Axis:   61  Text Interpretation: Sinus rhythm Borderline T abnormalities, diffuse leads since last tracing no significant change Confirmed by Rolan Bucco [phone]) on 11/23/2023 1:22:20 PM  Radiology DG Chest 2 View Result Date: 11/23/2023 CLINICAL DATA:  Cough and shortness of breath for 1 week. EXAM: CHEST - 2 VIEW COMPARISON:  One-view chest x-ray 06/22/2022. FINDINGS: The heart size is mildly enlarged. Lung volumes are low. No edema or effusion is present. No focal airspace disease is present. IMPRESSION: 1. Low lung volumes. 2. No acute cardiopulmonary disease. Electronically Signed   By: Marin Roberts M.D.   On: 11/23/2023 16:51   CT Head Wo Contrast Result Date: 11/23/2023 CLINICAL DATA:  Neuro deficit, acute, stroke suspected.  Numbness. EXAM: CT HEAD WITHOUT CONTRAST TECHNIQUE: Contiguous axial images were obtained from the base of the skull through the vertex without intravenous contrast. RADIATION DOSE REDUCTION: This exam was performed according to the departmental dose-optimization program which includes automated exposure control, adjustment of the mA and/or kV according to patient size and/or use of iterative reconstruction technique. COMPARISON:  None Available. FINDINGS: Brain: No acute infarct, hemorrhage, or mass lesion is present. No significant white matter lesions are present. Deep brain nuclei are within normal limits. The ventricles are of normal size. No significant extraaxial fluid collection is present. The brainstem and cerebellum are within normal limits. A relatively empty sella is present. Midline structures are otherwise within normal limits. Vascular: Atherosclerotic calcifications are present within the cavernous internal  carotid arteries bilaterally. Hyperdense vessel is present. Skull: Calvarium is intact. No focal lytic or blastic lesions are present. No significant extracranial soft tissue lesion is present. Sinuses/Orbits: Fluid is present the right maxillary sinus and left sphenoid sinus. The paranasal sinuses and mastoid air cells are otherwise clear. The globes and orbits are within normal limits. IMPRESSION: 1. No acute intracranial abnormality or significant white matter disease. 2. Relatively empty sella. This is nonspecific, but can be seen in the setting of idiopathic intracranial hypertension. 3. Right maxillary and left sphenoid sinus disease. Electronically Signed   By: Marin Roberts M.D.   On: 11/23/2023 16:47    Procedures Procedures    Medications Ordered in ED Medications  ipratropium-albuterol (DUONEB) 0.5-2.5 (3) MG/3ML nebulizer solution 3 mL (3 mLs Nebulization Given 11/23/23 1532)    ED Course/ Medical Decision Making/ A&P  Medical Decision Making Amount and/or Complexity of Data Reviewed Labs: ordered. Radiology: ordered.  Risk Prescription drug management.  This patient is a 58 y.o. female  who presents to the ED for concern of facial numbness.   Differential diagnoses prior to evaluation: The emergent differential diagnosis includes, but is not limited to,  CVA, TIA, hypertensive urgency/emergency, trigeminal neuralgia, sinusitis, electrolyte abnormalities, MS. This is not an exhaustive differential.   Past Medical History / Co-morbidities / Social History: fibroids, sleep apnea  Physical Exam: Physical exam performed. The pertinent findings include: Hypertensive, otherwise normal vitals, no distress. Bilateral maxillary sinus tenderness with reported decreased sensation, otherwise neurologic exam normal as above.   Lab Tests/Imaging studies: I personally interpreted labs/imaging and the pertinent results include:  CBC and BMP  unremarkable. CXR and CT head without acute abnormalities. I agree with the radiologist interpretation.  Cardiac monitoring: EKG obtained and interpreted by myself and attending physician which shows: sinus rhythm   Medications: I ordered medication including duoneb.  I have reviewed the patients home medicines and have made adjustments as needed. Pt feels her breathing is much improved.    Disposition: After consideration of the diagnostic results and the patients response to treatment, I feel that emergency department workup does not suggest an emergent condition requiring admission or immediate intervention beyond what has been performed at this time. The plan is: discharge to home. Will give antibiotics for possible sinus infection, nebulizer solution and prednisone for likely bronchitis due to persistent cough.  Overall very low clinical suspicion for CVA/TIA based on pattern of symptoms and otherwise reassuring neurologic exam. While BP is elevated, pt having no headache or blurry vision, and readings not in the range of hypertensive emergency. The patient is safe for discharge and has been instructed to return immediately for worsening symptoms, change in symptoms or any other concerns.  Final Clinical Impression(s) / ED Diagnoses Final diagnoses:  Facial paresthesia  Acute non-recurrent maxillary sinusitis  Bronchitis    Rx / DC Orders ED Discharge Orders          Ordered    methylPREDNISolone (MEDROL DOSEPAK) 4 MG TBPK tablet        11/23/23 1721    albuterol (PROVENTIL) (5 MG/ML) 0.5% nebulizer solution  Every 6 hours PRN        11/23/23 1721    amoxicillin-clavulanate (AUGMENTIN) 875-125 MG tablet  Every 12 hours        11/23/23 1721    fluconazole (DIFLUCAN) 150 MG tablet   Once        11/23/23 1721           Portions of this report may have been transcribed using voice recognition software. Every effort was made to ensure accuracy; however, inadvertent computerized  transcription errors may be present.    Jeanella Flattery 11/23/23 1805    Rolan Bucco, MD 11/29/23 0003
